# Patient Record
Sex: Female | Born: 1960 | Race: White | Hispanic: No | Marital: Married | Smoking: Former smoker
Health system: Southern US, Community
[De-identification: ages and names within clinical notes are randomized; demographics above are authoritative.]

## PROBLEM LIST (undated history)

## (undated) DIAGNOSIS — K219 Gastro-esophageal reflux disease without esophagitis: Secondary | ICD-10-CM

## (undated) DIAGNOSIS — I2510 Atherosclerotic heart disease of native coronary artery without angina pectoris: Secondary | ICD-10-CM

## (undated) DIAGNOSIS — D219 Benign neoplasm of connective and other soft tissue, unspecified: Secondary | ICD-10-CM

## (undated) DIAGNOSIS — M8580 Other specified disorders of bone density and structure, unspecified site: Secondary | ICD-10-CM

## (undated) DIAGNOSIS — E785 Hyperlipidemia, unspecified: Secondary | ICD-10-CM

## (undated) DIAGNOSIS — I10 Essential (primary) hypertension: Secondary | ICD-10-CM

## (undated) HISTORY — PX: TONSILLECTOMY AND ADENOIDECTOMY: SHX28

## (undated) HISTORY — DX: Other specified disorders of bone density and structure, unspecified site: M85.80

## (undated) HISTORY — DX: Benign neoplasm of connective and other soft tissue, unspecified: D21.9

## (undated) HISTORY — DX: Gastro-esophageal reflux disease without esophagitis: K21.9

## (undated) HISTORY — PX: CARDIAC CATHETERIZATION: SHX172

## (undated) HISTORY — PX: APPENDECTOMY: SHX54

---

## 2009-02-09 HISTORY — PX: ABDOMINAL HYSTERECTOMY: SHX81

## 2012-09-07 ENCOUNTER — Ambulatory Visit (INDEPENDENT_AMBULATORY_CARE_PROVIDER_SITE_OTHER): Payer: Self-pay | Admitting: Gynecology

## 2012-09-07 ENCOUNTER — Encounter: Payer: Self-pay | Admitting: Gynecology

## 2012-09-07 ENCOUNTER — Other Ambulatory Visit (HOSPITAL_COMMUNITY)
Admission: RE | Admit: 2012-09-07 | Discharge: 2012-09-07 | Disposition: A | Discharge status: Home / Self Care | Payer: BC Managed Care – PPO | Source: Ambulatory Visit | Attending: Gynecology | Admitting: Gynecology

## 2012-09-07 VITALS — BP 132/86 | Ht 61.5 in | Wt 140.0 lb

## 2012-09-07 DIAGNOSIS — Z780 Asymptomatic menopausal state: Secondary | ICD-10-CM | POA: Insufficient documentation

## 2012-09-07 DIAGNOSIS — Z1159 Encounter for screening for other viral diseases: Secondary | ICD-10-CM

## 2012-09-07 DIAGNOSIS — Z01419 Encounter for gynecological examination (general) (routine) without abnormal findings: Secondary | ICD-10-CM

## 2012-09-07 DIAGNOSIS — R6882 Decreased libido: Secondary | ICD-10-CM | POA: Insufficient documentation

## 2012-09-07 DIAGNOSIS — N951 Menopausal and female climacteric states: Secondary | ICD-10-CM

## 2012-09-07 DIAGNOSIS — Z1151 Encounter for screening for human papillomavirus (HPV): Secondary | ICD-10-CM | POA: Insufficient documentation

## 2012-09-07 MED ORDER — EST ESTROGENS-METHYLTEST 0.625-1.25 MG PO TABS: 1.0000 | ORAL_TABLET | Freq: Every day | ORAL | Status: DC

## 2012-09-07 NOTE — Patient Instructions (Addendum)
Hormone Therapy At menopause, your body begins making less estrogen and progesterone hormones. This causes the body to stop having menstrual periods. This is because estrogen and progesterone hormones control your periods and menstrual cycle. A lack of estrogen may cause symptoms such as:  Hot flushes (or hot flashes).  Vaginal dryness.  Dry skin.  Loss of sex drive.  Risk of bone loss (osteoporosis). When this happens, you may choose to take hormone therapy to get back the estrogen lost during menopause. When the hormone estrogen is given alone, it is usually referred to as ET (Estrogen Therapy). When the hormone progestin is combined with estrogen, it is generally called HT (Hormone Therapy). This was formerly known as hormone replacement therapy (HRT). Your caregiver can help you make a decision on what will be best for you. The decision to use HT seems to change often as new studies are done. Many studies do not agree on the benefits of hormone replacement therapy. LIKELY BENEFITS OF HT INCLUDE PROTECTION FROM:  Hot Flushes (also called hot flashes) - A hot flush is a sudden feeling of heat that spreads over the face and body. The skin may redden like a blush. It is connected with sweats and sleep disturbance. Women going through menopause may have hot flushes a few times a month or several times per day depending on the woman.  Osteoporosis (bone loss)- Estrogen helps guard against bone loss. After menopause, a woman's bones slowly lose calcium and become weak and brittle. As a result, bones are more likely to break. The hip, wrist, and spine are affected most often. Hormone therapy can help slow bone loss after menopause. Weight bearing exercise and taking calcium with vitamin D also can help prevent bone loss. There are also medications that your caregiver can prescribe that can help prevent osteoporosis.  Vaginal Dryness - Loss of estrogen causes changes in the vagina. Its lining may  become thin and dry. These changes can cause pain and bleeding during sexual intercourse. Dryness can also lead to infections. This can cause burning and itching. (Vaginal estrogen treatment can help relieve pain, itching, and dryness.)  Urinary Tract Infections are more common after menopause because of lack of estrogen. Some women also develop urinary incontinence because of low estrogen levels in the vagina and bladder.  Possible other benefits of estrogen include a positive effect on mood and short-term memory in women. RISKS AND COMPLICATIONS  Using estrogen alone without progesterone causes the lining of the uterus to grow. This increases the risk of lining of the uterus (endometrial) cancer. Your caregiver should give another hormone called progestin if you have a uterus.  Women who take combined (estrogen and progestin) HT appear to have an increased risk of breast cancer. The risk appears to be small, but increases throughout the time that HT is taken.  Combined therapy also makes the breast tissue slightly denser which makes it harder to read mammograms (breast X-rays).  Combined, estrogen and progesterone therapy can be taken together every day, in which case there may be spotting of blood. HT therapy can be taken cyclically in which case you will have menstrual periods. Cyclically means HT is taken for a set amount of days, then not taken, then this process is repeated.  HT may increase the risk of stroke, heart attack, breast cancer and forming blood clots in your leg.  Transdermal estrogen (estrogen that is absorbed through the skin with a patch or a cream) may have more positive results with:    Cholesterol.  Blood pressure.  Blood clots. Having the following conditions may indicate you should not have HT:  Endometrial cancer.  Liver disease.  Breast cancer.  Heart disease.  History of blood clots.  Stroke. TREATMENT   If you choose to take HT and have a uterus,  usually estrogen and progestin are prescribed.  Your caregiver will help you decide the best way to take the medications.  Possible ways to take estrogen include:  Pills.  Patches.  Gels.  Sprays.  Vaginal estrogen cream, rings and tablets.  It is best to take the lowest dose possible that will help your symptoms and take them for the shortest period of time that you can.  Hormone therapy can help relieve some of the problems (symptoms) that affect women at menopause. Before making a decision about HT, talk to your caregiver about what is best for you. Be well informed and comfortable with your decisions. HOME CARE INSTRUCTIONS   Follow your caregivers advice when taking the medications.  A Pap test is done to screen for cervical cancer.  The first Pap test should be done at age 21.  Between ages 21 and 29, Pap tests are repeated every 2 years.  Beginning at age 30, you are advised to have a Pap test every 3 years as long as your past 3 Pap tests have been normal.  Some women have medical problems that increase the chance of getting cervical cancer. Talk to your caregiver about these problems. It is especially important to talk to your caregiver if a new problem develops soon after your last Pap test. In these cases, your caregiver may recommend more frequent screening and Pap tests.  The above recommendations are the same for women who have or have not gotten the vaccine for HPV (Human Papillomavirus).  If you had a hysterectomy for a problem that was not a cancer or a condition that could lead to cancer, then you no longer need Pap tests. However, even if you no longer need a Pap test, a regular exam is a good idea to make sure no other problems are starting.   If you are between ages 65 and 70, and you have had normal Pap tests going back 10 years, you no longer need Pap tests. However, even if you no longer need a Pap test, a regular exam is a good idea to make sure no  other problems are starting.   If you have had past treatment for cervical cancer or a condition that could lead to cancer, you need Pap tests and screening for cancer for at least 20 years after your treatment.  If Pap tests have been discontinued, risk factors (such as a new sexual partner) need to be re-assessed to determine if screening should be resumed.  Some women may need screenings more often if they are at high risk for cervical cancer.  Get mammograms done as per the advice of your caregiver. SEEK IMMEDIATE MEDICAL CARE IF:  You develop abnormal vaginal bleeding.  You have pain or swelling in your legs, shortness of breath, or chest pain.  You develop dizziness or headaches.  You have lumps or changes in your breasts or armpits.  You have slurred speech.  You develop weakness or numbness of your arms or legs.  You have pain, burning, or bleeding when urinating.  You develop abdominal pain. Document Released: 10/25/2002 Document Revised: 04/20/2011 Document Reviewed: 02/12/2010 ExitCare Patient Information 2014 [employer]. Menopause Menopause is the normal time   of life when menstrual periods stop completely. Menopause is complete when you have missed 12 consecutive menstrual periods. It usually occurs between the ages of 48 to 55, with an average age of 51. Very rarely does a woman develop menopause before 52 years old. At menopause, your ovaries stop producing the female hormones, estrogen and progesterone. This can cause undesirable symptoms and also affect your health. Sometimes the symptoms may occur 4 to 5 years before the menopause begins. There is no relationship between menopause and:  Oral contraceptives.  Number of children you had.  Race.  The age your menstrual periods started (menarche). Heavy smokers and very thin women may develop menopause earlier in life. CAUSES  The ovaries stop producing the female hormones estrogen and  progesterone.  Other causes include:  Surgery to remove both ovaries.  The ovaries stop functioning for no known reason.  Tumors of the pituitary gland in the brain.  Medical disease that affects the ovaries and hormone production.  Radiation treatment to the abdomen or pelvis.  Chemotherapy that affects the ovaries. SYMPTOMS   Hot flashes.  Night sweats.  Decrease in sex drive.  Vaginal dryness and thinning of the vagina causing painful intercourse.  Dryness of the skin and developing wrinkles.  Headaches.  Tiredness.  Irritability.  Memory problems.  Weight gain.  Bladder infections.  Hair growth of the face and chest.  Infertility. More serious symptoms include:  Loss of bone (osteoporosis) causing breaks (fractures).  Depression.  Hardening and narrowing of the arteries (atherosclerosis) causing heart attacks and strokes. DIAGNOSIS   When the menstrual periods have stopped for 12 straight months.  Physical exam.  Hormone studies of the blood. TREATMENT  There are many treatment choices and nearly as many questions about them. The decisions to treat or not to treat menopausal changes is an individual choice made with your caregiver. Your caregiver can discuss the treatments with you. Together, you can decide which treatment will work best for you. Your treatment choices may include:   Hormone therapy (estorgen and progesterone).  Non-hormonal medications.  Treating the individual symptoms with medication (for example antidepressants for depression).  Herbal medications that may help specific symptoms.  Counseling by a psychiatrist or psychologist.  Group therapy.  Lifestyle changes including:  Eating healthy.  Regular exercise.  Limiting caffeine and alcohol.  Stress management and meditation.  No treatment. HOME CARE INSTRUCTIONS   Take the medication your caregiver gives you as directed.  Get plenty of sleep and  rest.  Exercise regularly.  Eat a diet that contains calcium (good for the bones) and soy products (acts like estrogen hormone).  Avoid alcoholic beverages.  Do not smoke.  If you have hot flashes, dress in layers.  Take supplements, calcium and vitamin D to strengthen bones.  You can use over-the-counter lubricants or moisturizers for vaginal dryness.  Group therapy is sometimes very helpful.  Acupuncture may be helpful in some cases. SEEK MEDICAL CARE IF:   You are not sure you are in menopause.  You are having menopausal symptoms and need advice and treatment.  You are still having menstrual periods after age 55.  You have pain with intercourse.  Menopause is complete (no menstrual period for 12 months) and you develop vaginal bleeding.  You need a referral to a specialist (gynecologist, psychiatrist or psychologist) for treatment. SEEK IMMEDIATE MEDICAL CARE IF:   You have severe depression.  You have excessive vaginal bleeding.  You fell and think you have a broken   bone.  You have pain when you urinate.  You develop leg or chest pain.  You have a fast pounding heart beat (palpitations).  You have severe headaches.  You develop vision problems.  You feel a lump in your breast.  You have abdominal pain or severe indigestion. Document Released: 04/18/2003 Document Revised: 04/20/2011 Document Reviewed: 11/24/2007 ExitCare Patient Information 2014 [employer].  

## 2012-09-07 NOTE — Progress Notes (Signed)
[name] 1960/09/04 [id number]   History:    52 y.o.  for annual gyn exam new patient to the practice. Patient stated that she had a normal gynecological examination 2 years ago and was constant. Patient also many years ago had a supracervical hysterectomy as a result of anemia, and leiomyomatous uteri and menorrhagia. Patient denies any prior history of abnormal Pap smear. She has had occasional hot flashes and irritability and mood swings but mostly she's been having decreased libido. She denies any vaginal dryness. She reports normal mammogram 2 years ago and was constant. She also reports a normal colonoscopy in 2013. Patient has not had a bone density done yet.  Past medical history,surgical history, family history and social history were all reviewed and documented in the EPIC chart.  Gynecologic History No LMP recorded. Patient has had a hysterectomy. Contraception: status post hysterectomy Last Pap: 2 years ago. Results were: normal Last mammogram: 2 years ago. Results were: normal  Obstetric History OB History   Grav Para Term Preterm Abortions TAB SAB Ect Mult Living   4 3   1  1   3      # Outc Date GA Lbr Len/2nd Wgt Sex Del Anes PTL Lv   1 PAR            2 PAR            3 PAR            4 SAB                ROS: A ROS was performed and pertinent positives and negatives are included in the history.  GENERAL: No fevers or chills. HEENT: No change in vision, no earache, sore throat or sinus congestion. NECK: No pain or stiffness. CARDIOVASCULAR: No chest pain or pressure. No palpitations. PULMONARY: No shortness of breath, cough or wheeze. GASTROINTESTINAL: No abdominal pain, nausea, vomiting or diarrhea, melena or bright red blood per rectum. GENITOURINARY: No urinary frequency, urgency, hesitancy or dysuria. MUSCULOSKELETAL: No joint or muscle pain, no back pain, no recent trauma. DERMATOLOGIC: No rash, no itching, no lesions. ENDOCRINE: No polyuria, polydipsia, no  heat or cold intolerance. No recent change in weight. HEMATOLOGICAL: No anemia or easy bruising or bleeding. NEUROLOGIC: No headache, seizures, numbness, tingling or weakness. PSYCHIATRIC: No depression, no loss of interest in normal activity or change in sleep pattern.     Exam: chaperone present  BP 132/86  Ht 5' 1.5" (1.562 m)  Wt 140 lb (63.504 kg)  BMI 26.03 kg/m2  Body mass index is 26.03 kg/(m^2).  General appearance : Well developed well nourished female. No acute distress HEENT: Neck supple, trachea midline, no carotid bruits, no thyroidmegaly Lungs: Clear to auscultation, no rhonchi or wheezes, or rib retractions  Heart: Regular rate and rhythm, no murmurs or gallops Breast:Examined in sitting and supine position were symmetrical in appearance, no palpable masses or tenderness,  no skin retraction, no nipple inversion, no nipple discharge, no skin discoloration, no axillary or supraclavicular lymphadenopathy Abdomen: no palpable masses or tenderness, no rebound or guarding Extremities: no edema or skin discoloration or tenderness  Pelvic:  Bartholin, Urethra, Skene Glands: Within normal limits             Vagina: No gross lesions or discharge  Cervix: No gross lesions or discharge  Uterus absence  Adnexa  Without masses or tenderness  Anus and perineum  normal   Rectovaginal  normal sphincter tone without palpated masses or  tenderness             Hemoccult Heart provided     Assessment/Plan:  52 y.o. female for annual exam with signs and symptoms consistent with menopause. We will confirmed by doing an [facility] today. Pap smear was done today at the new guidelines were discussed. Blood work was done recently by her PCP. Requisition to schedule mammogram was provided. We discussed importance of calcium and vitamin D for osteoporosis prevention as well as regular exercise. Patient will need a baseline bone density study next year.  New CDC guidelines is recommending patients be  tested once in her lifetime for hepatitis C antibody who were born between [birth year] through 1965. This was discussed with the patient today and has agreed to be tested today.  Hemoccult cards were provided for the patient is admitted to the office for testing.  Patient will be prescribed Estratest 0.625 mg to take 1 by mouth daily  If her  [facility] indicates that she is indeed in the menopause state. The risks benefits and pros and cons of hormone replacement therapy were discussed include DVT and breast cancer risk. We went through a detail discussion of women's health initiative study. Patient fully understands and accepts. All questions were answered and literature information was provided as well.    Ok Edwards MD, 12:30 PM 09/07/2012

## 2012-09-08 ENCOUNTER — Encounter: Payer: Self-pay | Admitting: Gynecology

## 2012-09-26 ENCOUNTER — Other Ambulatory Visit: Payer: Self-pay | Admitting: Gynecology

## 2012-09-26 DIAGNOSIS — Z1231 Encounter for screening mammogram for malignant neoplasm of breast: Secondary | ICD-10-CM

## 2012-10-14 ENCOUNTER — Ambulatory Visit (HOSPITAL_COMMUNITY)
Admission: RE | Admit: 2012-10-14 | Discharge: 2012-10-14 | Disposition: A | Discharge status: Home / Self Care | Payer: BC Managed Care – PPO | Source: Ambulatory Visit | Attending: Gynecology | Admitting: Gynecology

## 2012-10-14 DIAGNOSIS — Z1231 Encounter for screening mammogram for malignant neoplasm of breast: Secondary | ICD-10-CM | POA: Insufficient documentation

## 2012-10-21 ENCOUNTER — Other Ambulatory Visit: Payer: Self-pay | Admitting: Gynecology

## 2012-10-21 DIAGNOSIS — Z1231 Encounter for screening mammogram for malignant neoplasm of breast: Secondary | ICD-10-CM

## 2012-12-15 ENCOUNTER — Other Ambulatory Visit: Payer: Self-pay

## 2013-04-07 ENCOUNTER — Other Ambulatory Visit: Payer: Self-pay

## 2013-04-07 MED ORDER — EST ESTROGENS-METHYLTEST 0.625-1.25 MG PO TABS: 1.0000 | ORAL_TABLET | Freq: Every day | ORAL | Status: DC

## 2013-09-20 ENCOUNTER — Other Ambulatory Visit (HOSPITAL_COMMUNITY)
Admission: RE | Admit: 2013-09-20 | Discharge: 2013-09-20 | Disposition: A | Discharge status: Home / Self Care | Payer: BC Managed Care – PPO | Source: Ambulatory Visit | Attending: Gynecology | Admitting: Gynecology

## 2013-09-20 ENCOUNTER — Encounter: Payer: Self-pay | Admitting: Gynecology

## 2013-09-20 ENCOUNTER — Ambulatory Visit (INDEPENDENT_AMBULATORY_CARE_PROVIDER_SITE_OTHER): Payer: BC Managed Care – PPO | Admitting: Gynecology

## 2013-09-20 VITALS — BP 122/80 | Ht 61.5 in | Wt 136.8 lb

## 2013-09-20 DIAGNOSIS — Z79890 Hormone replacement therapy (postmenopausal): Secondary | ICD-10-CM

## 2013-09-20 DIAGNOSIS — Z01419 Encounter for gynecological examination (general) (routine) without abnormal findings: Secondary | ICD-10-CM | POA: Insufficient documentation

## 2013-09-20 DIAGNOSIS — N950 Postmenopausal bleeding: Secondary | ICD-10-CM | POA: Insufficient documentation

## 2013-09-20 MED ORDER — PROGESTERONE MICRONIZED 200 MG PO CAPS: 200.0000 mg | ORAL_CAPSULE | Freq: Every day | ORAL | Status: DC

## 2013-09-20 MED ORDER — EST ESTROGENS-METHYLTEST 0.625-1.25 MG PO TABS: 1.0000 | ORAL_TABLET | Freq: Every day | ORAL | Status: DC

## 2013-09-20 NOTE — Progress Notes (Signed)
[name] [birth year] [id number]   History:    53 y.o.  for annual gyn exam who was seen last year for the first time as a new patient to the practice. Patient reported having had in [place] several years ago a supracervical hysterectomy as a result of anemia, leiomyomatous uteri and menorrhagia. She denied any prior history of any abnormal Pap smear. She was having hot flashes mood swings and irritability and was perimenopausal and was started on Estratest 0.625 mg daily to help as well with her decreased libido. Patient has been taking 2-3 times a week instead of daily but stated that he has helped with her vasomotor symptoms. She did mention that she has had some vaginal spotting at times for 2 months but lasting only one day. Her PCP has been doing her blood work.  Past medical history,surgical history, family history and social history were all reviewed and documented in the EPIC chart.  Gynecologic History No LMP recorded. Patient has had a hysterectomy. Contraception: status post hysterectomy Last Pap: 2014. Results were: normal Last mammogram: 2014. Results were: normal  Obstetric History OB History  Gravida Para Term Preterm AB SAB TAB Ectopic Multiple Living  4 3   1 1    3     # Outcome Date GA Lbr Len/2nd Weight Sex Delivery Anes PTL Lv  4 SAB           3 PAR           2 PAR           1 PAR                ROS: A ROS was performed and pertinent positives and negatives are included in the history.  GENERAL: No fevers or chills. HEENT: No change in vision, no earache, sore throat or sinus congestion. NECK: No pain or stiffness. CARDIOVASCULAR: No chest pain or pressure. No palpitations. PULMONARY: No shortness of breath, cough or wheeze. GASTROINTESTINAL: No abdominal pain, nausea, vomiting or diarrhea, melena or bright red blood per rectum. GENITOURINARY: No urinary frequency, urgency, hesitancy or dysuria. MUSCULOSKELETAL: No joint or muscle  pain, no back pain, no recent trauma. DERMATOLOGIC: No rash, no itching, no lesions. ENDOCRINE: No polyuria, polydipsia, no heat or cold intolerance. No recent change in weight. HEMATOLOGICAL: No anemia or easy bruising or bleeding. NEUROLOGIC: No headache, seizures, numbness, tingling or weakness. PSYCHIATRIC: No depression, no loss of interest in normal activity or change in sleep pattern.     Exam: chaperone present  BP 122/80  Ht 5' 1.5" (1.562 m)  Wt 136 lb 12.8 oz (62.052 kg)  BMI 25.43 kg/m2  Body mass index is 25.43 kg/(m^2).  General appearance : Well developed well nourished female. No acute distress HEENT: Neck supple, trachea midline, no carotid bruits, no thyroidmegaly Lungs: Clear to auscultation, no rhonchi or wheezes, or rib retractions  Heart: Regular rate and rhythm, no murmurs or gallops Breast:Examined in sitting and supine position were symmetrical in appearance, no palpable masses or tenderness,  no skin retraction, no nipple inversion, no nipple discharge, no skin discoloration, no axillary or supraclavicular lymphadenopathy Abdomen: no palpable masses or tenderness, no rebound or guarding Extremities: no edema or skin discoloration or tenderness  Pelvic:  Bartholin, Urethra, Skene Glands: Within normal limits             Vagina: No gross lesions or discharge  Cervix: No gross lesions or discharge  Uterus  supposedly absent  Adnexa  Without masses or tenderness  Anus and perineum  normal   Rectovaginal  normal sphincter tone without palpated masses or tenderness             Hemoccult PCP we'll provide   Because of patient's postmenopausal bleeding on HRT after a Pap smear was done her cervix is dilated and we attempted to do an endometrial biopsy. Interestingly the catheter went in to a depth of 7 cm??. No tissue obtained to submit for histological evaluation. Patient became nauseated and lightheaded and she was given Zofran 8 mg sublingual and did well. She was  given and Aleve for cramping.  Assessment/Plan:  53 y.o. female for annual exam with postmenopausal bleeding with presumed supracervical hysterectomy had a sounding of the uterine cavity up to 7 cm? No tissue obtained when attempted to do an endometrial biopsy. She will return back to the office next week for a sonohysterogram for better assessment of how much uterus and she still has. We are going to continue the Estratest 0.625 mg daily and it was stressed upon her the importance of taking it daily and not interrupting daily dosage. She will be prescribed Prometrium 200 mg one by mouth daily for 12 days of the month. A Pap smear was done today. She was reminded to schedule her upcoming mammogram next month. Requisition for shingles vaccine provided.  Note: This dictation was prepared with  Dragon/digital dictation along withSmart phrase technology. Any transcriptional errors that result from this process are unintentional.   Terrance Mass MD, 10:37 AM 09/20/2013

## 2013-09-20 NOTE — Patient Instructions (Addendum)
Shingles Vaccine What You Need to Know WHAT IS SHINGLES?  Shingles is a painful skin rash, often with blisters. It is also called Herpes Zoster or just Zoster.  A shingles rash usually appears on one side of the face or body and lasts from 2 to 4 weeks. Its main symptom is pain, which can be quite severe. Other symptoms of shingles can include fever, headache, chills, and upset stomach. Very rarely, a shingles infection can lead to pneumonia, hearing problems, blindness, brain inflammation (encephalitis), or death.  For about 1 person in 5, severe pain can continue even after the rash clears up. This is called post-herpetic neuralgia.  Shingles is caused by the Varicella Zoster virus. This is the same virus that causes chickenpox. Only someone who has had a case of chickenpox or rarely, has gotten chickenpox vaccine, can get shingles. The virus stays in your body. It can reappear many years later to cause a case of shingles.  You cannot catch shingles from another person with shingles. However, a person who has never had chickenpox (or chickenpox vaccine) could get chickenpox from someone with shingles. This is not very common.  Shingles is far more common in people 50 and older than in younger people. It is also more common in people whose immune systems are weakened because of a disease such as cancer or drugs such as steroids or chemotherapy.  At least 1 million people get shingles per year in the [place]. SHINGLES VACCINE  A vaccine for shingles was licensed in 2006. In clinical trials, the vaccine reduced the risk of shingles by 50%. It can also reduce the pain in people who still get shingles after being vaccinated.  A single dose of shingles vaccine is recommended for adults 60 years of age and older. SOME PEOPLE SHOULD NOT GET SHINGLES VACCINE OR SHOULD WAIT A person should not get shingles vaccine if he or she:  Has ever had a life-threatening allergic reaction to gelatin, the  antibiotic neomycin, or any other component of shingles vaccine. Tell your caregiver if you have any severe allergies.  Has a weakened immune system because of current:  AIDS or another disease that affects the immune system.  Treatment with drugs that affect the immune system, such as prolonged use of high-dose steroids.  Cancer treatment, such as radiation or chemotherapy.  Cancer affecting the bone marrow or lymphatic system, such as leukemia or lymphoma.  Is pregnant, or might be pregnant. Women should not become pregnant until at least 4 weeks after getting shingles vaccine. Someone with a minor illness, such as a cold, may be vaccinated. Anyone with a moderate or severe acute illness should usually wait until he or she recovers before getting the vaccine. This includes anyone with a temperature of 101.3 F (38 C) or higher. WHAT ARE THE RISKS FROM SHINGLES VACCINE?  A vaccine, like any medicine, could possibly cause serious problems, such as severe allergic reactions. However, the risk of a vaccine causing serious harm, or death, is extremely small.  No serious problems have been identified with shingles vaccine. Mild Problems  Redness, soreness, swelling, or itching at the site of the injection (about 1 person in 3).  Headache (about 1 person in 70). Like all vaccines, shingles vaccine is being closely monitored for unusual or severe problems. WHAT IF THERE IS A MODERATE OR SEVERE REACTION? What should I look for? Any unusual condition, such as a severe allergic reaction or a high fever. If a severe allergic reaction   occurred, it would be within a few minutes to an hour after the shot. Signs of a serious allergic reaction can include difficulty breathing, weakness, hoarseness or wheezing, a fast heartbeat, hives, dizziness, paleness, or swelling of the throat. What should I do?  Call your caregiver, or get the person to a caregiver right away.  Tell the caregiver what  happened, the date and time it happened, and when the vaccination was given.  Ask the caregiver to report the reaction by filing a Vaccine Adverse Event Reporting System (VAERS) form. Or, you can file this report through the VAERS web site at www.vaers.SamedayNews.es or by calling (501) 352-7610. VAERS does not provide medical advice. HOW CAN I LEARN MORE?  Ask your caregiver. He or she can give you the vaccine package insert or suggest other sources of information.  Contact the Centers for Disease Control and Prevention (CDC):  Call (867)840-5271 (1-800-CDC-INFO).  Visit the CDC website at http://hunter.com/ CDC Shingles Vaccine VIS (11/15/07) Document Released: 11/23/2005 Document Revised: 04/20/2011 Document Reviewed: 05/18/2012 [facility] Patient Information 2015 [employer]. This information is not intended to replace advice given to you by your health care provider. Make sure you discuss any questions you have with your health care provider. Transvaginal Ultrasound Transvaginal ultrasound is a pelvic ultrasound, using a metal probe that is placed in the vagina, to look at a women's female organs. Transvaginal ultrasound is a method of seeing inside the pelvis of a woman. The ultrasound machine sends out sound waves from the transducer (probe). These sound waves bounce off body structures (like an echo) to create a picture. The picture shows up on a monitor. It is called transvaginal because the probe is inserted into the vagina. There should be very little discomfort from the vaginal probe. This test can also be used during pregnancy. Endovaginal ultrasound is another name for a transvaginal ultrasound. In a transabdominal ultrasound, the probe is placed on the outside of the belly. This method gives pictures that are lower quality than pictures from the transvaginal technique. Transvaginal ultrasound is used to look for problems of the female genital tract. Some such problems  include: Infertility problems. Congenital (birth defect) malformations of the uterus and ovaries. Tumors in the uterus. Abnormal bleeding. Ovarian tumors and cysts. Abscess (inflamed tissue around pus) in the pelvis. Unexplained abdominal or pelvic pain. Pelvic infection. DURING PREGNANCY, TRANSVAGINAL ULTRASOUND MAY BE USED TO LOOK AT: Normal pregnancy. Ectopic pregnancy (pregnancy outside the uterus). Fetal heartbeat. Abnormalities in the pelvis, that are not seen well with transabdominal ultrasound. Suspected twins or multiples. Impending miscarriage. Problems with the cervix (incompetent cervix, not able to stay closed and hold the baby). When doing an amniocentesis (removing fluid from the pregnancy sac, for testing). Looking for abnormalities of the baby. Checking the growth, development, and age of the fetus. Measuring the amount of fluid in the amniotic sac. When doing an external version of the baby (moving baby into correct position). Evaluating the baby for problems in high risk pregnancies (biophysical profile). Suspected fetal demise (death). Sometimes a special ultrasound method called Saline Infusion Sonography (SIS) is used for a more accurate look at the uterus. Sterile saline (salt water) is injected into the uterus of non-pregnant patients to see the inside of the uterus better. SIS is not used on pregnant women. The vaginal probe can also assist in obtaining biopsies of abnormal areas, in draining fluid from cysts on the ovary, and in finding IUDs (intrauterine device, birth control) that cannot be located. PREPARATION FOR  TEST A transvaginal ultrasound is done with the bladder empty. The transabdominal ultrasound is done with your bladder full. You may be asked to drink several glasses of water before that exam. Sometimes, a transabdominal ultrasound is done just after a transvaginal ultrasound, to look at organs in your abdomen. PROCEDURE  You will lie down on a  table, with your knees bent and your feet in foot holders. The probe is covered with a condom. A sterile lubricant is put into the vagina and on the probe. The lubricant helps transmit the sound waves and avoid irritating the vagina. Your caregiver will move the probe inside the vaginal cavity to scan the pelvic structures. A normal test will show a normal pelvis and normal contents. An abnormal test will show abnormalities of the pelvis, placenta, or baby. ABNORMAL RESULTS MAY BE DUE TO: Growths or tumors in the: Uterus. Ovaries. Vagina. Other pelvic structures. Non-cancerous growths of the uterus and ovaries. Twisting of the ovary, cutting off blood supply to the ovary (ovarian torsion). Areas of infection, including: Pelvic inflammatory disease. Abscess in the pelvis. Locating an IUD. PROBLEMS FOUND IN PREGNANT WOMEN MAY INCLUDE: Ectopic pregnancy (pregnancy outside the uterus). Multiple pregnancies. Early dilation (opening) of the cervix. This may indicate an incompetent cervix and early delivery. Impending miscarriage. Fetal death. Problems with the placenta, including: Placenta has grown over the opening of the womb (placenta previa). Placenta has separated early in the womb (placental abruption). Placenta grows into the muscle of the uterus (placenta accreta). Tumors of pregnancy, including gestational trophoblastic disease. This is an abnormal pregnancy, with no fetus. The uterus is filled with many grape-like cysts that could sometimes be cancerous. Incorrect position of the fetus (breech, vertex). Intrauterine fetal growth retardation (IUGR) (poor growth in the womb). Fetal abnormalities or infection. RISKS AND COMPLICATIONS There are no known risks to the ultrasound procedure. There is no X-ray used when doing an ultrasound. Document Released: 01/08/2004 Document Revised: 04/20/2011 Document Reviewed: 12/26/2008 [facility] Patient Information 2015 [place], [place]. This  information is not intended to replace advice given to you by your health care provider. Make sure you discuss any questions you have with your health care provider.

## 2013-09-21 LAB — CYTOLOGY - PAP

## 2013-09-28 ENCOUNTER — Other Ambulatory Visit: Payer: Self-pay | Admitting: Gynecology

## 2013-09-28 DIAGNOSIS — N950 Postmenopausal bleeding: Secondary | ICD-10-CM

## 2013-09-28 DIAGNOSIS — Z90711 Acquired absence of uterus with remaining cervical stump: Secondary | ICD-10-CM

## 2013-09-28 DIAGNOSIS — Z79890 Hormone replacement therapy (postmenopausal): Secondary | ICD-10-CM

## 2013-10-18 ENCOUNTER — Other Ambulatory Visit: Payer: Self-pay | Admitting: Gynecology

## 2013-10-18 ENCOUNTER — Ambulatory Visit (INDEPENDENT_AMBULATORY_CARE_PROVIDER_SITE_OTHER): Payer: BC Managed Care – PPO

## 2013-10-18 ENCOUNTER — Ambulatory Visit (INDEPENDENT_AMBULATORY_CARE_PROVIDER_SITE_OTHER): Payer: BC Managed Care – PPO | Admitting: Gynecology

## 2013-10-18 DIAGNOSIS — Z90711 Acquired absence of uterus with remaining cervical stump: Secondary | ICD-10-CM

## 2013-10-18 DIAGNOSIS — Z79890 Hormone replacement therapy (postmenopausal): Secondary | ICD-10-CM

## 2013-10-18 DIAGNOSIS — N83209 Unspecified ovarian cyst, unspecified side: Secondary | ICD-10-CM

## 2013-10-18 DIAGNOSIS — N950 Postmenopausal bleeding: Secondary | ICD-10-CM

## 2013-10-18 NOTE — Progress Notes (Signed)
   Patient presented to the office today for further evaluation of her postmenopausal bleeding. Patient was seen in the office as a new patient on August 12.Patient reported having had in [place] several years ago a supracervical hysterectomy as a result of anemia, leiomyomatous uteri and menorrhagia. She denied any prior history of any abnormal Pap smear. She was having hot flashes mood swings and irritability and was perimenopausal and was started on Estratest 0.625 mg daily to help as well with her decreased libido. Patient has been taking 2-3 times a week instead of daily but stated that he has helped with her vasomotor symptoms. She did mention that she has had some vaginal spotting at times for 2 months but lasting only one day.  It was interesting to the fact that time of her annual exam when she was examined she had a Pap smear that was done and we attempted to introduce a Pipelle which appeared to have entered some form of cavity up to 7 cm and this is the reason she is here today for a sonohysterogram. No tissue was obtained when attempting to do an endometrial biopsy. Since it was recommended the patient take her Estratest every day with the addition of the Prometrium for 12 days of the month.  Sonohysterogram today. Absent uterus. Cervix was seen and appeared to be normal. No fluid in the cul-de-sac. Right ovary was normal. Left thin-walled cyst measuring 17 x 15 mm avascular was noted to free within septum was noted. The cervix was cleansed with Betadine solution and a sterile catheter was introduced through the cervical canal and saline was instilled in an effort to see if there was any endometrial cavity. The cervix but no further uterine tissue was noted. No defect in the cervix. No free fluid in the cul-de-sac. No adnexal fluid was noted.  Assessment/plan: Menopausal patient had issues with compliance on her HRT consisting of Estratest 0.625 mg which she was recommended  to take on a daily basis. It appears she has some active endometrial glands and the remaining lower uterine segment after supracervical hysterectomy but for this reason I recommended that she had Prometrium 200 mg one by mouth daily for 12 days a month. Her recent Pap smear was normal. She was reassured. We will continue to monitor closely otherwise we will see her back next for her annual exam or when necessary

## 2013-12-11 ENCOUNTER — Encounter: Payer: Self-pay | Admitting: Gynecology

## 2014-01-18 ENCOUNTER — Other Ambulatory Visit: Payer: Self-pay | Admitting: Gynecology

## 2014-01-18 DIAGNOSIS — Z1231 Encounter for screening mammogram for malignant neoplasm of breast: Secondary | ICD-10-CM

## 2014-02-14 ENCOUNTER — Other Ambulatory Visit: Payer: Self-pay

## 2014-02-14 MED ORDER — EST ESTROGENS-METHYLTEST 0.625-1.25 MG PO TABS: 1.0000 | ORAL_TABLET | Freq: Every day | ORAL | Status: DC

## 2014-03-13 ENCOUNTER — Ambulatory Visit (HOSPITAL_COMMUNITY)
Admission: RE | Admit: 2014-03-13 | Discharge: 2014-03-13 | Disposition: A | Discharge status: Home / Self Care | Payer: BLUE CROSS/BLUE SHIELD | Source: Ambulatory Visit | Attending: Gynecology | Admitting: Gynecology

## 2014-03-13 DIAGNOSIS — Z1231 Encounter for screening mammogram for malignant neoplasm of breast: Secondary | ICD-10-CM | POA: Insufficient documentation

## 2014-06-19 ENCOUNTER — Other Ambulatory Visit: Payer: Self-pay | Admitting: Gynecology

## 2014-08-28 ENCOUNTER — Other Ambulatory Visit: Payer: Self-pay

## 2014-08-28 MED ORDER — EST ESTROGENS-METHYLTEST 0.625-1.25 MG PO TABS: 1.0000 | ORAL_TABLET | Freq: Every day | ORAL | Status: DC

## 2014-10-12 ENCOUNTER — Encounter: Payer: Self-pay | Admitting: Gynecology

## 2014-10-12 ENCOUNTER — Ambulatory Visit (INDEPENDENT_AMBULATORY_CARE_PROVIDER_SITE_OTHER): Payer: BLUE CROSS/BLUE SHIELD | Admitting: Gynecology

## 2014-10-12 VITALS — BP 138/80 | Ht 61.75 in | Wt 135.0 lb

## 2014-10-12 DIAGNOSIS — Z01419 Encounter for gynecological examination (general) (routine) without abnormal findings: Secondary | ICD-10-CM

## 2014-10-12 DIAGNOSIS — Z780 Asymptomatic menopausal state: Secondary | ICD-10-CM

## 2014-10-12 NOTE — Patient Instructions (Signed)
Densitometra sea  (Bone Densitometry) La densitometra sea es una radiografa especial que mide la densidad de los huesos y se utiliza para predecir el riesgo de fracturas seas. Esta estudio se utiliza para determinar el contenido mineral y la densidad de los huesos para diagnosticar osteoporosis. La osteoporosis es la prdida de tejido seo que hace que el hueso se debilite. Generalmente ocurre en las mujeres que entran en la menopausia. Pero tambin pueden sufrirla los hombres y personas con otras enfermedades.  PREPARACIN PARA LA PRUEBA  No es necesaria la preparacin.  QUIENES DEBEN EXAMINARSE?   Todas las mujeres mayores de 65 aos.  Las mujeres posmenopusicas (50 a 65 aos) con factores de riesgo para osteoporosis.  Las personas que han sufrido fracturas previas realizando actividades normales.  Las personas de contextura corporal delgada (menos de 127 libras [63.5 kg] o con un ndice de masa corporal [IMC] de menos de 21).  Las personas que tengan un padre que haya sufrido una fractura de cadera o que tengan antecedentes de osteoporosis.  Los fumadores.  Las personas que sufren artritis reumatoidea.  Los que consumen alcohol en exceso (ms de 3 medidas la mayor parte de los das).  Las mujeres con menopausia temprana. CUNDO DEBE REALIZAR UN NUEVO ESTUDIO?  Las guas actuales sugieren que se debe esperar por lo menos 2 aos antes de repetir una prueba de densidad sea, si la primera fue normal. Algunos estudios recientes indican que las mujeres con densidad sea normal pueden esperar unos aos antes de repetir un estudio de densitometra sea. Comente estos temas con su mdico.  HALLAZGOS NORMALES:   Normal: menos de una desviacin estndar por debajo de lo normal (superior a -1).  Osteopenia:  1 a 2,5 desviaciones estndar por debajo de lo normal (-1 a -2,5).  Osteoporosis: ms de 2,5 desviaciones estndar por debajo de lo normal (menos de -2,5). Los resultados se  informan como una "puntuacin T" y una "puntuacin Z". La puntuacin T es el nmero que compara la densidad sea con la densidad sea de las mujeres jvenes y sanas. La puntuacin Z es un nmero que compara la densidad sea con las puntuaciones de mujeres de la misma edad, gnero y raza.  Los rangos para los resultados normales pueden variar entre diferentes laboratorios y hospitales. Consulte siempre con su mdico despus de hacer el estudio para comentar el significado de los resultados y si los valores se consideran "dentro de los lmites normales".  SIGNIFICADO DEL ESTUDIO  El mdico leer los resultados y comentar con usted la importancia y el significado de los resultados, as como las opciones de tratamiento y la necesidad de pruebas adicionales, si fuera necesario.  OBTENCIN DE LOS RESULTADOS DE LAS PRUEBAS  Es su responsabilidad retirar el resultado del estudio. Consulte en el laboratorio cuando y cmo podr obtener los resultados.  Document Released: 10/21/2011 [employer] Patient Information 2015 [employer]. This information is not intended to replace advice given to you by your health care provider. Make sure you discuss any questions you have with your health care provider.  

## 2014-10-12 NOTE — Progress Notes (Signed)
[name] 08-30-1960 [id number]   History:    54 y.o.  for annual gyn exam with no complaints today. Patient last year had been evaluated for postmenopausal bleeding she had been on unopposed estrogen after a supracervical hysterectomy having been done in [facility] as a result of anemia and leiomyomatous uteri and menorrhagia. Patient had an ultrasound and attempted sonohysterogram in our office on September 2015 with the following findings:  Sonohysterogram today. Absent uterus. Cervix was seen and appeared to be normal. No fluid in the cul-de-sac. Right ovary was normal. Left thin-walled cyst measuring 17 x 15 mm avascular was noted to free within septum was noted. The cervix was cleansed with Betadine solution and a sterile catheter was introduced through the cervical canal and saline was instilled in an effort to see if there was any endometrial cavity. The cervix but no further uterine tissue was noted. No defect in the cervix. No free fluid in the cul-de-sac. No adnexal fluid was noted.  To the Estratest 0.625 mg that she was taking daily she was started then on Prometrium 200 mg for the first 12 days of the month as had no further bleeding. Patient had a normal colonoscopy in 2013. No prior history of abnormal Pap smear. Mammogram this year was normal. Patient is not had her bone density study yet. She is going to see her PCP and her blood work next month.  Past medical history,surgical history, family history and social history were all reviewed and documented in the EPIC chart.  Gynecologic History No LMP recorded. Patient has had a hysterectomy. Contraception: status post hysterectomy Last Pap: 2015. Results were: normal Last mammogram: 2016. Results were: normal  Obstetric History OB History  Gravida Para Term Preterm AB SAB TAB Ectopic Multiple Living  4 3   1 1    3     # Outcome Date GA Lbr Len/2nd Weight Sex Delivery Anes PTL Lv  4 SAB             3 Para           2 Para           1 Para                ROS: A ROS was performed and pertinent positives and negatives are included in the history.  GENERAL: No fevers or chills. HEENT: No change in vision, no earache, sore throat or sinus congestion. NECK: No pain or stiffness. CARDIOVASCULAR: No chest pain or pressure. No palpitations. PULMONARY: No shortness of breath, cough or wheeze. GASTROINTESTINAL: No abdominal pain, nausea, vomiting or diarrhea, melena or bright red blood per rectum. GENITOURINARY: No urinary frequency, urgency, hesitancy or dysuria. MUSCULOSKELETAL: No joint or muscle pain, no back pain, no recent trauma. DERMATOLOGIC: No rash, no itching, no lesions. ENDOCRINE: No polyuria, polydipsia, no heat or cold intolerance. No recent change in weight. HEMATOLOGICAL: No anemia or easy bruising or bleeding. NEUROLOGIC: No headache, seizures, numbness, tingling or weakness. PSYCHIATRIC: No depression, no loss of interest in normal activity or change in sleep pattern.     Exam: chaperone present  BP 138/80 mmHg  Ht 5' 1.75" (1.568 m)  Wt 135 lb (61.236 kg)  BMI 24.91 kg/m2  Body mass index is 24.91 kg/(m^2).  General appearance : Well developed well nourished female. No acute distress HEENT: Eyes: no retinal hemorrhage or exudates,  Neck supple, trachea midline, no carotid bruits, no thyroidmegaly Lungs: Clear to auscultation, no rhonchi  or wheezes, or rib retractions  Heart: Regular rate and rhythm, no murmurs or gallops Breast:Examined in sitting and supine position were symmetrical in appearance, no palpable masses or tenderness,  no skin retraction, no nipple inversion, no nipple discharge, no skin discoloration, no axillary or supraclavicular lymphadenopathy Abdomen: no palpable masses or tenderness, no rebound or guarding Extremities: no edema or skin discoloration or tenderness  Pelvic:  Bartholin, Urethra, Skene Glands: Within normal limits             Vagina:  No gross lesions or discharge  Cervix: No gross lesions or discharge  Uterus absent  Adnexa  Without masses or tenderness  Anus and perineum  normal   Rectovaginal  normal sphincter tone without palpated masses or tenderness             Hemoccult cards provided     Assessment/Plan:  54 y.o. female for annual exam doing well on HRT consisting of Estratest 0.625 mg daily with the addition of Prometrium 200 mg for the 12 days of the month no reported vaginal bleeding. Pap smear not indicated this year. PCP we'll be doing her blood work. Patient not interested in the flu vaccine. Fecal Hemoccult cards were provided for her to submit to the office for testing. Patient to schedule bone density study. We discussed importance of calcium vitamin D and regular exercise for osteoporosis prevention. She has lost 5 pounds since last year. We discussed importance of monthly breast exams.   Terrance Mass MD, 12:10 PM 10/12/2014

## 2014-10-23 ENCOUNTER — Other Ambulatory Visit: Payer: Self-pay | Admitting: Gynecology

## 2014-10-23 ENCOUNTER — Ambulatory Visit (INDEPENDENT_AMBULATORY_CARE_PROVIDER_SITE_OTHER): Payer: BLUE CROSS/BLUE SHIELD

## 2014-10-23 DIAGNOSIS — M899 Disorder of bone, unspecified: Secondary | ICD-10-CM | POA: Diagnosis not present

## 2014-10-23 DIAGNOSIS — Z780 Asymptomatic menopausal state: Secondary | ICD-10-CM | POA: Diagnosis not present

## 2014-10-23 DIAGNOSIS — Z13820 Encounter for screening for osteoporosis: Secondary | ICD-10-CM

## 2014-10-23 DIAGNOSIS — M8580 Other specified disorders of bone density and structure, unspecified site: Secondary | ICD-10-CM

## 2014-11-09 ENCOUNTER — Other Ambulatory Visit: Payer: Self-pay | Admitting: *Deleted

## 2014-11-09 MED ORDER — EST ESTROGENS-METHYLTEST 0.625-1.25 MG PO TABS: 1.0000 | ORAL_TABLET | Freq: Every day | ORAL | Status: DC

## 2014-11-09 NOTE — Telephone Encounter (Signed)
Only 5 refills allowed on this medication per epic,

## 2015-09-17 ENCOUNTER — Other Ambulatory Visit: Payer: Self-pay | Admitting: Gynecology

## 2015-09-17 DIAGNOSIS — Z1231 Encounter for screening mammogram for malignant neoplasm of breast: Secondary | ICD-10-CM

## 2015-10-21 ENCOUNTER — Ambulatory Visit: Payer: BLUE CROSS/BLUE SHIELD

## 2015-11-08 ENCOUNTER — Ambulatory Visit
Admission: RE | Admit: 2015-11-08 | Discharge: 2015-11-08 | Disposition: A | Discharge status: Home / Self Care | Payer: Managed Care, Other (non HMO) | Source: Ambulatory Visit | Attending: Gynecology | Admitting: Gynecology

## 2015-11-08 DIAGNOSIS — Z1231 Encounter for screening mammogram for malignant neoplasm of breast: Secondary | ICD-10-CM

## 2016-05-14 ENCOUNTER — Ambulatory Visit (INDEPENDENT_AMBULATORY_CARE_PROVIDER_SITE_OTHER): Payer: 59 | Admitting: Gynecology

## 2016-05-14 ENCOUNTER — Encounter: Payer: Self-pay | Admitting: Gynecology

## 2016-05-14 VITALS — BP 122/78 | Ht 62.5 in | Wt 144.8 lb

## 2016-05-14 DIAGNOSIS — Z01419 Encounter for gynecological examination (general) (routine) without abnormal findings: Secondary | ICD-10-CM

## 2016-05-14 DIAGNOSIS — M8580 Other specified disorders of bone density and structure, unspecified site: Secondary | ICD-10-CM | POA: Diagnosis not present

## 2016-05-14 NOTE — Patient Instructions (Signed)
Densitometra sea (Bone Densitometry) La densitometra sea es un estudio de diagnstico por imgenes en el que se utiliza una radiografa especial que mide la cantidad de calcio y otros minerales en los huesos (densidad sea). Este estudio tambin se conoce como examen de densidad mineral sea o radioabsorciometra de doble energa (DEXA). Puede medir la densidad sea en la cadera y la columna. Es similar a una radiografa comn. Tambin pueden hacerle este estudio para:  Diagnosticar una enfermedad que causa huesos dbiles o delgados (osteoporosis).  Predecir el riesgo de un hueso roto (fractura).  Determinar si el tratamiento para la osteoporosis funciona. INFORME A SU MDICO:  Cualquier alergia que tenga.  Todos los medicamentos que utiliza, incluidos vitaminas, hierbas, gotas oftlmicas, cremas y medicamentos de venta libre.  Problemas previos que usted o los miembros de su familia hayan tenido con el uso de anestsicos.  Enfermedades de la sangre que tenga.  Si tiene cirugas previas.  Enfermedades que tenga.  Probabilidad de embarazo.  Cualquier otro estudio mdico al que se haya sometido en los ltimos 14 das en el que se haya utilizado material de contraste. RIESGOS Y COMPLICACIONES En general, se trata de un procedimiento seguro. Sin embargo, pueden ocurrir algunos problemas, entre los que se pueden incluir los siguientes:  Este estudio lo expone a una cantidad muy pequea de radiacin.  Los riesgos de la exposicin a la radiacin pueden ser mayores para los nios por nacer. ANTES DEL PROCEDIMIENTO  No tome ningn suplemento de calcio durante 24 horas antes de realizarse el estudio. Puede comer y beber como lo hace habitualmente.  Qutese todas las joyas de metal, anteojos, aparatos dentales y cualquier otro objeto metlico. PROCEDIMIENTO  Deber recostarse en una camilla. Un generador de rayos X estar ubicado debajo de usted y un dispositivo de imgenes, por  encima.  Se pueden usar otros dispositivos, como cajas o abrazaderas, para posicionar el cuerpo apropiadamente para la exploracin.  Deber permanecer inmvil mientras la mquina explore lentamente su cuerpo.  Las imgenes se muestran en el monitor de una computadora. DESPUS DEL PROCEDIMIENTO Es posible que necesite estudios adicionales ms adelante. Esta informacin no tiene como fin reemplazar el consejo del mdico. Asegrese de hacerle al mdico cualquier pregunta que tenga. Document Released: 10/21/2011 Document Revised: 02/16/2014 Document Reviewed: 07/06/2013 [employer] Interactive Patient Education  2017 [employer]  

## 2016-05-14 NOTE — Progress Notes (Signed)
[name] [birth year] [id number]   History:    56 y.o.  for annual gyn exam  with no complaints today. Patient has not been seen in the office since 2016. Patient had been on Estratest 0.625 mg daily with the addition of Prometrium 200 mg for the first 12 days of the month but has been off of it for over a year and has no vasomotor symptoms and no vaginal bleeding and is otherwise asymptomatic. Review of her record indicated that her colonoscopy was normal in 2013. She had a supracervical hysterectomy in [place] several years ago as a result of symptomatic leiomyomatous uteri (menorrhagia and anemia). Patient with no previous history of any abnormal Pap smears.  Bone density study here in our office in 2016 demonstrated evidence of osteopenia (decreased bone mineralization) right femoral neck T score -1.4 with normal Frax analysis. Patient's PCP Dr. Nonda Lou has been doing her blood work and all her vaccines are up-to-date to include the shingles vaccine.   Past medical history,surgical history, family history and social history were all reviewed and documented in the EPIC chart.  Gynecologic History No LMP recorded. Patient has had a hysterectomy. Contraception: status post hysterectomy Last Pap: 2015. Results were: normal Last mammogram: 2017. Results were: normal  Obstetric History OB History  Gravida Para Term Preterm AB Living  4 3     1 3   SAB TAB Ectopic Multiple Live Births  1            # Outcome Date GA Lbr Len/2nd Weight Sex Delivery Anes PTL Lv  4 SAB           3 Para           2 Para           1 Para                ROS: A ROS was performed and pertinent positives and negatives are included in the history.  GENERAL: No fevers or chills. HEENT: No change in vision, no earache, sore throat or sinus congestion. NECK: No pain or stiffness. CARDIOVASCULAR: No chest pain or pressure. No palpitations. PULMONARY: No shortness of  breath, cough or wheeze. GASTROINTESTINAL: No abdominal pain, nausea, vomiting or diarrhea, melena or bright red blood per rectum. GENITOURINARY: No urinary frequency, urgency, hesitancy or dysuria. MUSCULOSKELETAL: No joint or muscle pain, no back pain, no recent trauma. DERMATOLOGIC: No rash, no itching, no lesions. ENDOCRINE: No polyuria, polydipsia, no heat or cold intolerance. No recent change in weight. HEMATOLOGICAL: No anemia or easy bruising or bleeding. NEUROLOGIC: No headache, seizures, numbness, tingling or weakness. PSYCHIATRIC: No depression, no loss of interest in normal activity or change in sleep pattern.     Exam: chaperone present  BP 122/78   Ht 5' 2.5" (1.588 m)   Wt 144 lb 12.8 oz (65.7 kg)   BMI 26.06 kg/m   Body mass index is 26.06 kg/m.  General appearance : Well developed well nourished female. No acute distress HEENT: Eyes: no retinal hemorrhage or exudates,  Neck supple, trachea midline, no carotid bruits, no thyroidmegaly Lungs: Clear to auscultation, no rhonchi or wheezes, or rib retractions  Heart: Regular rate and rhythm, no murmurs or gallops Breast:Examined in sitting and supine position were symmetrical in appearance, no palpable masses or tenderness,  no skin retraction, no nipple inversion, no nipple discharge, no skin discoloration, no axillary or supraclavicular lymphadenopathy Abdomen: no palpable masses or tenderness, no rebound  or guarding Extremities: no edema or skin discoloration or tenderness  Pelvic:  Bartholin, Urethra, Skene Glands: Within normal limits             Vagina: No gross lesions or discharge  Cervix: No gross lesions or discharge  Uterus  history of supracervical hysterectomy  Adnexa  Without masses or tenderness  Anus and perineum  normal   Rectovaginal  normal sphincter tone without palpated masses or tenderness             Hemoccult cards will be provided     Assessment/Plan:  56 y.o. female for annual exam doing well  and no longer on hormone replacement therapy. We discussed importance of calcium and vitamin D and weightbearing exercises for osteoporosis prevention. She is due for her next bone density study in September of this year. PCP has done her blood work and her vaccines are up-to-date. Pap smear without HPV screening was done today according to the guidelines. Patient was provided with fecal Hemoccult cards for testing which she will submit to the office at a later date. She was reminded also that she needs her mammogram in September this year.  Terrance Mass MD, 10:47 AM 05/14/2016

## 2016-06-24 ENCOUNTER — Encounter: Payer: Self-pay | Admitting: Gynecology

## 2016-10-10 DIAGNOSIS — M8580 Other specified disorders of bone density and structure, unspecified site: Secondary | ICD-10-CM

## 2016-10-10 HISTORY — DX: Other specified disorders of bone density and structure, unspecified site: M85.80

## 2016-10-14 ENCOUNTER — Other Ambulatory Visit: Payer: Self-pay | Admitting: Gynecology

## 2016-10-14 DIAGNOSIS — Z13820 Encounter for screening for osteoporosis: Secondary | ICD-10-CM

## 2016-10-27 ENCOUNTER — Encounter: Payer: Self-pay | Admitting: Gynecology

## 2016-10-27 ENCOUNTER — Telehealth: Payer: Self-pay | Admitting: Gynecology

## 2016-10-27 ENCOUNTER — Ambulatory Visit (INDEPENDENT_AMBULATORY_CARE_PROVIDER_SITE_OTHER): Payer: BLUE CROSS/BLUE SHIELD

## 2016-10-27 DIAGNOSIS — Z13820 Encounter for screening for osteoporosis: Secondary | ICD-10-CM

## 2016-10-27 DIAGNOSIS — M8589 Other specified disorders of bone density and structure, multiple sites: Secondary | ICD-10-CM

## 2016-10-27 NOTE — Telephone Encounter (Signed)
Tell patient her most recent bone density shows osteopenia with slight loss from her prior study of both the spine and right hip. The calculated fracture risk is not increased indicate the need for medication at this time. I would recommend weightbearing exercise on a regular basis such as walking, checking a vitamin D level either at her primary physician's office or our office to make sure she is in the therapeutic range. I would also recommend total dietary calcium and 1500 mg daily. Repeat the bone density in 2 years.

## 2016-10-28 ENCOUNTER — Other Ambulatory Visit: Payer: Self-pay | Admitting: Gynecology

## 2016-10-28 DIAGNOSIS — M8589 Other specified disorders of bone density and structure, multiple sites: Secondary | ICD-10-CM

## 2016-10-28 DIAGNOSIS — Z13820 Encounter for screening for osteoporosis: Secondary | ICD-10-CM

## 2016-10-29 ENCOUNTER — Encounter: Payer: Self-pay | Admitting: *Deleted

## 2016-10-29 NOTE — Telephone Encounter (Signed)
Sent patient mychart message

## 2016-11-12 NOTE — Telephone Encounter (Signed)
Patient informed. 

## 2017-01-21 ENCOUNTER — Other Ambulatory Visit: Payer: Self-pay | Admitting: Obstetrics & Gynecology

## 2017-01-21 DIAGNOSIS — Z139 Encounter for screening, unspecified: Secondary | ICD-10-CM

## 2017-02-22 ENCOUNTER — Ambulatory Visit
Admission: RE | Admit: 2017-02-22 | Discharge: 2017-02-22 | Disposition: A | Discharge status: Home / Self Care | Payer: Commercial Managed Care - PPO | Source: Ambulatory Visit | Attending: Obstetrics & Gynecology | Admitting: Obstetrics & Gynecology

## 2017-02-22 DIAGNOSIS — Z139 Encounter for screening, unspecified: Secondary | ICD-10-CM

## 2017-12-03 ENCOUNTER — Inpatient Hospital Stay (HOSPITAL_COMMUNITY)
Admission: EM | Admit: 2017-12-03 | Discharge: 2017-12-12 | DRG: 234 | Disposition: A | Discharge status: Home / Self Care | Payer: Commercial Managed Care - PPO | Attending: Cardiothoracic Surgery | Admitting: Cardiothoracic Surgery

## 2017-12-03 ENCOUNTER — Emergency Department (HOSPITAL_COMMUNITY): Payer: Commercial Managed Care - PPO

## 2017-12-03 ENCOUNTER — Encounter (HOSPITAL_COMMUNITY): Payer: Self-pay

## 2017-12-03 DIAGNOSIS — M8580 Other specified disorders of bone density and structure, unspecified site: Secondary | ICD-10-CM | POA: Diagnosis present

## 2017-12-03 DIAGNOSIS — Z419 Encounter for procedure for purposes other than remedying health state, unspecified: Secondary | ICD-10-CM

## 2017-12-03 DIAGNOSIS — I25110 Atherosclerotic heart disease of native coronary artery with unstable angina pectoris: Principal | ICD-10-CM

## 2017-12-03 DIAGNOSIS — D62 Acute posthemorrhagic anemia: Secondary | ICD-10-CM | POA: Diagnosis not present

## 2017-12-03 DIAGNOSIS — S0000XA Unspecified superficial injury of scalp, initial encounter: Secondary | ICD-10-CM

## 2017-12-03 DIAGNOSIS — R079 Chest pain, unspecified: Secondary | ICD-10-CM

## 2017-12-03 DIAGNOSIS — R51 Headache: Secondary | ICD-10-CM | POA: Diagnosis present

## 2017-12-03 DIAGNOSIS — Z8249 Family history of ischemic heart disease and other diseases of the circulatory system: Secondary | ICD-10-CM

## 2017-12-03 DIAGNOSIS — R739 Hyperglycemia, unspecified: Secondary | ICD-10-CM | POA: Diagnosis not present

## 2017-12-03 DIAGNOSIS — I25119 Atherosclerotic heart disease of native coronary artery with unspecified angina pectoris: Secondary | ICD-10-CM

## 2017-12-03 DIAGNOSIS — Z882 Allergy status to sulfonamides status: Secondary | ICD-10-CM

## 2017-12-03 DIAGNOSIS — J9382 Other air leak: Secondary | ICD-10-CM | POA: Diagnosis not present

## 2017-12-03 DIAGNOSIS — I259 Chronic ischemic heart disease, unspecified: Secondary | ICD-10-CM

## 2017-12-03 DIAGNOSIS — E8770 Fluid overload, unspecified: Secondary | ICD-10-CM | POA: Diagnosis not present

## 2017-12-03 DIAGNOSIS — I10 Essential (primary) hypertension: Secondary | ICD-10-CM | POA: Diagnosis present

## 2017-12-03 DIAGNOSIS — K219 Gastro-esophageal reflux disease without esophagitis: Secondary | ICD-10-CM | POA: Diagnosis present

## 2017-12-03 DIAGNOSIS — Z79899 Other long term (current) drug therapy: Secondary | ICD-10-CM

## 2017-12-03 DIAGNOSIS — Z951 Presence of aortocoronary bypass graft: Secondary | ICD-10-CM

## 2017-12-03 DIAGNOSIS — E782 Mixed hyperlipidemia: Secondary | ICD-10-CM | POA: Diagnosis present

## 2017-12-03 DIAGNOSIS — Z90710 Acquired absence of both cervix and uterus: Secondary | ICD-10-CM

## 2017-12-03 LAB — BASIC METABOLIC PANEL
Anion gap: 10 (ref 5–15)
BUN: 14 mg/dL (ref 6–20)
CALCIUM: 9.7 mg/dL (ref 8.9–10.3)
CHLORIDE: 107 mmol/L (ref 98–111)
CO2: 22 mmol/L (ref 22–32)
Creatinine, Ser: 0.76 mg/dL (ref 0.44–1.00)
GFR calc Af Amer: >60 mL/min (ref >60)
GFR calc non Af Amer: >60 mL/min (ref >60)
GLUCOSE: 97 mg/dL (ref 70–99)
Potassium: 3.4 mmol/L — ABNORMAL LOW (ref 3.5–5.1)
Sodium: 139 mmol/L (ref 135–145)

## 2017-12-03 LAB — CBC
HEMATOCRIT: 40 % (ref 36.0–46.0)
Hemoglobin: 13.3 g/dL (ref 12.0–15.0)
MCH: 27.5 pg (ref 26.0–34.0)
MCHC: 33.3 g/dL (ref 30.0–36.0)
MCV: 82.6 fL (ref 80.0–100.0)
NRBC: 0 % (ref 0.0–0.2)
Platelets: 389 10*3/uL (ref 150–400)
RBC: 4.84 MIL/uL (ref 3.87–5.11)
RDW: 13 % (ref 11.5–15.5)
WBC: 7.3 10*3/uL (ref 4.0–10.5)

## 2017-12-03 LAB — I-STAT TROPONIN, ED: TROPONIN I, POC: 0 ng/mL (ref 0.00–0.08)

## 2017-12-03 MED ORDER — METOPROLOL TARTRATE 5 MG/5ML IV SOLN
10.0000 mg | Freq: Once | INTRAVENOUS | Status: AC
  Administered 2017-12-03: 10 mg via INTRAVENOUS
  Filled 2017-12-03: qty 10

## 2017-12-03 MED ORDER — ALPRAZOLAM 0.25 MG PO TABS
0.2500 mg | ORAL_TABLET | Freq: Once | ORAL | Status: AC
  Administered 2017-12-03: 0.25 mg via ORAL

## 2017-12-03 MED ORDER — NITROGLYCERIN 0.4 MG SL SUBL
0.4000 mg | SUBLINGUAL_TABLET | SUBLINGUAL | Status: DC | PRN
  Administered 2017-12-03: 0.8 mg via SUBLINGUAL
  Filled 2017-12-03: qty 1

## 2017-12-03 MED ORDER — ALPRAZOLAM 0.25 MG PO TABS
0.5000 mg | ORAL_TABLET | Freq: Once | ORAL | Status: DC
  Filled 2017-12-03: qty 2

## 2017-12-03 MED ORDER — IOPAMIDOL (ISOVUE-370) INJECTION 76%
100.0000 mL | Freq: Once | INTRAVENOUS | Status: AC | PRN
  Administered 2017-12-03: 100 mL via INTRAVENOUS

## 2017-12-03 MED ORDER — METOPROLOL TARTRATE 25 MG PO TABS
100.0000 mg | ORAL_TABLET | Freq: Once | ORAL | Status: AC
  Administered 2017-12-03: 100 mg via ORAL
  Filled 2017-12-03: qty 4

## 2017-12-03 NOTE — ED Provider Notes (Signed)
Patient placed in Quick Look pathway, seen and evaluated   Chief Complaint: chest pain  HPI: [name] is a 57 y.o. female who presents to the ED with sudden onset of chest pain after getting upset with a coworker about an hour ago. Patient describes the pain as all over her chest with tingling in her fingers. She also c/o headache.   ROS: Neuro: headache  CV: chest pain,   Resp: shortness of breath  Physical Exam:  BP (!) 133/96   Pulse 92   Temp 98.6 F (37 C) (Oral)   Resp 16   SpO2 96%    Gen: No distress  Neuro: Awake and Alert  Skin: Warm and dry  Dr. Burt Knack, cardiology here to see the patient.      Initiation of care has begun. The patient has been counseled on the process, plan, and necessity for staying for the completion/evaluation, and the remainder of the medical screening examination    Ashley Murrain, NP 12/03/17 1419    Noemi Chapel, MD 12/04/17 [phone]

## 2017-12-03 NOTE — ED Provider Notes (Signed)
[facility] EMERGENCY DEPARTMENT Provider Note   CSN: [id number] Arrival date & time: 12/03/17  1309     History   Chief Complaint Chief Complaint  Patient presents with  . Chest Pain    HPI [name] is a 57 y.o. female with a history of HTN, GERD, osteopenia, and uterine fibroids who presents to the emergency department with a chief complaint of chest pain.  The patient endorses chest pain that radiated across the bilateral chest.  She characterizes the pain as dull and achy.  She reports the pain began around noon after she got into an argument with a coworker and lasted for approximately 1 to 2 hours.  Pain was constant during that time.  She reports associated nausea, mild dyspnea, numbness and tingling of the jaw and left hand as well as a headache.  She reports the numbness and tingling in her jaw and hand persisted for several hours after the chest pain resolved.  She reports the headache has remained mild and constant.  She denies diplopia, weakness, vomiting, abdominal pain, fever, cough, chills, neck pain or stiffness, diaphoresis, slurred speech, palpitations, or leg swelling.   He was treated with 324 mg of ASA prior to arrival.  She states that earlier she did feel more lightheaded like she might pass out.  She reports that she has had previous episodes of syncope secondary to low blood pressure, but not for some time.  No recent history of chest pain with exertion. Family hx of CAD- Mom in her 40s; Father in his 50s and Brother in his 35s.  Past medical history includes HTN on lisinopril and mixed hyperlipidemia on atorvastatin.   The history is provided by the patient. No language interpreter was used.    Past Medical History:  Diagnosis Date  . Acid reflux   . Fibroid   . Osteopenia 10/2016   T score -2.1 FRAX's 5.7%/0.3%    Patient Active Problem List   Diagnosis Date Noted  . Chest pain 12/04/2017  . Coronary artery disease  involving native coronary artery of native heart with angina pectoris ([place])   . Essential hypertension   . Chest pain at rest   . PMB (postmenopausal bleeding) 09/20/2013  . Menopause 09/07/2012  . Libido, decreased 09/07/2012    Past Surgical History:  Procedure Laterality Date  . ABDOMINAL HYSTERECTOMY  2011   SUPRACERVICAL HYSTERECTOMY  . APPENDECTOMY    . CESAREAN SECTION     X3  . TONSILLECTOMY AND ADENOIDECTOMY       OB History    Gravida  4   Para  3   Term      Preterm      AB  1   Living  3     SAB  1   TAB      Ectopic      Multiple      Live Births               Home Medications    Prior to Admission medications   Medication Sig Start Date End Date Taking? Authorizing Provider  atorvastatin (LIPITOR) 10 MG tablet Take 10 mg by mouth See admin instructions. Monday, Wednesday, Friday and Sunday   Yes [provider]  DULoxetine (CYMBALTA) 60 MG capsule Take 30 mg by mouth daily.    Yes [provider]  famotidine (PEPCID) 40 MG tablet Take 40 mg by mouth 2 (two) times daily.   Yes [provider]  lisinopril (PRINIVIL,ZESTRIL) 30 MG tablet Take 30 mg by mouth daily.   Yes [provider]    Family History Family History  Problem Relation Age of Onset  . Hypertension Mother   . Heart disease Mother   . Diabetes Father   . Hypertension Father   . Heart disease Father   . Diabetes Paternal Grandfather     Social History Social History   Tobacco Use  . Smoking status: Former Smoker    Last attempt to quit: 09/08/1994    Years since quitting: 23.2  . Smokeless tobacco: Never Used  Substance Use Topics  . Alcohol use: Yes    Alcohol/week: 0.0 standard drinks    Comment: occ  . Drug use: No     Allergies   Sulfa antibiotics   Review of Systems Review of Systems  Constitutional: Negative for activity change, chills and fever.  HENT: Negative for congestion and sore throat.   Eyes: Negative  for photophobia and visual disturbance.  Respiratory: Positive for shortness of breath. Negative for apnea, cough, choking and wheezing.   Cardiovascular: Negative for chest pain, palpitations and leg swelling.  Gastrointestinal: Positive for nausea. Negative for abdominal pain, diarrhea and vomiting.  Genitourinary: Negative for dysuria.  Musculoskeletal: Negative for back pain.  Skin: Negative for rash.  Allergic/Immunologic: Negative for immunocompromised state.  Neurological: Positive for light-headedness, numbness and headaches. Negative for dizziness, seizures, syncope and weakness.  Psychiatric/Behavioral: Negative for confusion.     Physical Exam Updated Vital Signs BP (!) 137/99   Pulse 69   Temp 98.6 F (37 C) (Oral)   Resp 16   SpO2 97%   Physical Exam  Constitutional: No distress.  HENT:  Head: Normocephalic.  Eyes: Pupils are equal, round, and reactive to light. Conjunctivae and EOM are normal.  Neck: Neck supple. No JVD present.  Full active and passive range of motion of the cervical spine.   Cardiovascular: Normal rate, regular rhythm, normal heart sounds and intact distal pulses. Exam reveals no gallop and no friction rub.  No murmur heard. Pulmonary/Chest: Effort normal. No stridor. No respiratory distress. She has no wheezes. She has no rales. She exhibits no tenderness.  Abdominal: Soft. She exhibits no distension and no mass. There is no tenderness. There is no rebound and no guarding. No hernia.  Musculoskeletal:  No tenderness to the cervical, thoracic, lumbar spinous processes or bilateral paraspinal muscles.  Neurological: She is alert.  Skin: Skin is warm. No rash noted. She is not diaphoretic.  Psychiatric: Her behavior is normal.  Nursing note and vitals reviewed.  ED Treatments / Results  Labs (all labs ordered are listed, but only abnormal results are displayed) Labs Reviewed  BASIC METABOLIC PANEL - Abnormal; Notable for the following  components:      Result Value   Potassium 3.4 (*)    All other components within normal limits  CBC  CBC  CREATININE, SERUM  TROPONIN I  HEMOGLOBIN A1C  HIV ANTIBODY (ROUTINE TESTING W REFLEX)  TROPONIN I  TROPONIN I  BRAIN NATRIURETIC PEPTIDE  LIPID PANEL  CBC  BASIC METABOLIC PANEL  I-STAT TROPONIN, ED    EKG EKG Interpretation  Date/Time:  Friday December 03 2017 13:19:25 EDT Ventricular Rate:  95 PR Interval:  122 QRS Duration: 80 QT Interval:  368 QTC Calculation: 462 R Axis:   36 Text Interpretation:  Normal sinus rhythm Nonspecific ST and T wave abnormality Prolonged QT Abnormal ECG Confirmed by Nat Christen (  [id number]) on 12/03/2017 9:20:34 PM   Radiology Dg Chest 2 View  Result Date: 12/03/2017 CLINICAL DATA:  Chest pain. EXAM: CHEST - 2 VIEW COMPARISON:  None. FINDINGS: The heart size and pulmonary vascularity are normal. Aortic atherosclerosis. Minimal atelectasis at the lung bases. No effusions. No significant bone abnormality. IMPRESSION: Minimal atelectasis at the lung bases. Electronically Signed   By: Lorriane Shire M.D.   On: 12/03/2017 14:19   Ct Coronary Morph W/cta Cor W/score W/ca W/cm &/or Wo/cm  Result Date: 12/03/2017 CLINICAL DATA:  Chest pain EXAM: Cardiac CTA MEDICATIONS: Sub lingual nitro. 4mg  x 2 TECHNIQUE: The patient was scanned on a Siemens 016 slice scanner. Gantry rotation speed was 250 msecs. Collimation was 0.6 mm. A 100 kV prospective scan was triggered in the ascending thoracic aorta at 35-75% of the R-R interval. Average HR during the scan was 60 bpm. The 3D data set was interpreted on a dedicated work station using MPR, MIP and VRT modes. A total of 80cc of contrast was used. FINDINGS: Non-cardiac: See separate report from [facility]. Pulmonary veins drain normally to the left atrium. Calcium Score: 648 Agatston units. Coronary Arteries: Right dominant with no anomalies LM: Calcified plaque distal left main, mild stenosis. LAD  system: There is extensive mixed plaque in the proximal and mid LAD. There appears to be a 75-90% range ostial LAD stenosis followed by an ectatic segment in the proximal LAD. There is mild stenosis in the mid LAD. Circumflex system: Mixed plaque proximal LCx without significant stenosis. RCA system: Mixed plaque proximal-mid RCA with 75-90% range stenosis in the mid RCA. IMPRESSION: 1. Coronary artery calcium score 648 Agatston units, placing the patient in the 99th percentile for age and gender. This suggests high risk for future cardiac events. 2.  Moderate to severe stenosis in the mid RCA and the ostial LAD. Dalton Mclean Electronically Signed   By: Loralie Champagne M.D.   On: 12/03/2017 23:10    Procedures .Critical Care Performed by: Joanne Gavel, PA-C Authorized by: Joanne Gavel, PA-C   Critical care provider statement:    Critical care time (minutes):  35   Critical care time was exclusive of:  Separately billable procedures and treating other patients and teaching time   Critical care was necessary to treat or prevent imminent or life-threatening deterioration of the following conditions:  Cardiac failure   Critical care was time spent personally by me on the following activities:  Ordering and review of laboratory studies, ordering and review of radiographic studies, ordering and performing treatments and interventions, re-evaluation of patient's condition, review of old charts, examination of patient, evaluation of patient's response to treatment, discussions with consultants, development of treatment plan with patient or surrogate and obtaining history from patient or surrogate   (including critical care time)  Medications Ordered in ED Medications  nitroGLYCERIN (NITROSTAT) SL tablet 0.4 mg (0.8 mg Sublingual Given 12/03/17 2158)  lisinopril (PRINIVIL,ZESTRIL) tablet 30 mg (has no administration in time range)  DULoxetine (CYMBALTA) DR capsule 30 mg (has no administration in  time range)  famotidine (PEPCID) tablet 40 mg (40 mg Oral Given 12/04/17 0141)  aspirin EC tablet 81 mg (has no administration in time range)  acetaminophen (TYLENOL) tablet 650 mg (has no administration in time range)  ondansetron (ZOFRAN) injection 4 mg (has no administration in time range)  heparin injection 5,000 Units (5,000 Units Subcutaneous Given 12/04/17 0141)  atorvastatin (LIPITOR) tablet 40 mg (has no administration in time range)  metoprolol tartrate (  LOPRESSOR) tablet 12.5 mg (has no administration in time range)  ALPRAZolam (XANAX) tablet 0.25 mg (0.25 mg Oral Given 12/03/17 1328)  metoprolol tartrate (LOPRESSOR) tablet 100 mg (100 mg Oral Given 12/03/17 2012)  metoprolol tartrate (LOPRESSOR) injection 10 mg (10 mg Intravenous Given 12/03/17 2148)  iopamidol (ISOVUE-370) 76 % injection 100 mL (100 mLs Intravenous Contrast Given 12/03/17 2156)     Initial Impression / Assessment and Plan / ED Course  I have reviewed the triage vital signs and the nursing notes.  Pertinent labs & imaging results that were available during my care of the patient were reviewed by me and considered in my medical decision making (see chart for details).     57 year old female with a history of GERD, osteopenia, and uterine fibroids who presents to the emergency department with chest pain, dyspnea, nausea, lightheadedness, and headache, onset earlier today.   On arrival, patient was normotensive without tachycardia or tachypnea. Moderate HEART score based on age, risk factors, EKG, and history.   Troponin was negative.  Mild hypokalemia at 3.4.  CBC is unremarkable.  Chest x-ray with minimal atelectasis in the bilateral bases.  EKG with normal sinus rhythm with nonspecific ST and T wave abnormalities.  After arrival in the ED, Dr. Burt Knack with cardiology came to evaluate the patient as the patient's daughter is a pharmacist with [employer]. He recommended coronary CTA/FFR. Dr. Aundra Dubin with  cardiology will plan to review the imaging. If normal, the patient will be written out of work for the weekend and if abnormal will disposition accordingly.    Spoke with CT tech who required metoprolol 10 mg IV and sublingual NTG x2 prior to procedure, which has been ordered.   Coronary CT with moderate to severe stenosis in the rmid RCA and ostial LAD with a coronary artery calcium score of 648 Agtston untis, placing the patient in the 99th percentile for age and gender. Spoke with Nila Nephew, cardiology fellow, who will admit with planned heart catheterization on 12/06/17.  Final Clinical Impressions(s) / ED Diagnoses   Final diagnoses:  Chest pain due to myocardial ischemia, unspecified ischemic chest pain type    ED Discharge Orders    None       Joanne Gavel, PA-C 12/04/17 6754    Nat Christen, MD 12/04/17 2257

## 2017-12-03 NOTE — ED Triage Notes (Signed)
Pt presents for evaluation of chest pain after getting upset with a coworker about an hour ago. Pt is hyperventilating, has tingling and dizziness to hands.

## 2017-12-03 NOTE — Consult Note (Signed)
Cardiology Consultation:   Patient ID: [name] MRN: [id number]; DOB: [birth year]  Admit date: 12/03/2017 Date of Consult: 12/03/2017  Primary Care Provider: Vicenta Aly, [place] Primary Cardiologist: No primary care provider on file.  Primary Electrophysiologist:  None    Patient Profile:   [name] is a 57 y.o. female with a hx of HTN and hyperlipidemia who is being seen today for the evaluation of chest pain at the request of Dr Sabra Heck.  History of Present Illness:   [name] has no past history of coronary artery disease.  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  While at work today, she became involved in a very stressful situation with her boss.  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  She began to develop central chest and left arm pain and tingling.  She describes a "funny feeling" in her chest.  She began to feel very weak and lightheaded.  She ultimately decided to lay down on the floor because she felt like she was close to passing out.  Associated symptoms included left arm and hand tingling, teeth and jaw pain, and blurry vision.  She continued to have chest pressure and left arm pain, but initially refused to call EMS.  Her daughter who is a Software engineer at [employer] came to the patient's work and tried to help her get up but she continued to be too weak to stand up.  At that point EMS was called and the patient was transported to the emergency department.  At the time of my interview, she states she continues to have a funny feeling in her chest but no active pain.  Her left hand symptoms are improving.  She continues to feel weak with some degree of dizziness.  She has no nausea or vomiting at present, but did feel nauseated at the time of the initial event.  The patient is not engaged in any regular exercise.  However, she denies chest pain or pressure  with exertion.  She takes lisinopril for treatment of hypertension and she takes atorvastatin 4 days/week for treatment of hyperlipidemia.  She has a family history of CAD as both parents had coronary bypass surgery in their 51s.  Past Medical History:  Diagnosis Date  . Acid reflux   . Fibroid   . Osteopenia 10/2016   T score -2.1 FRAX's 5.7%/0.3%    Past Surgical History:  Procedure Laterality Date  . ABDOMINAL HYSTERECTOMY  2011   SUPRACERVICAL HYSTERECTOMY  . APPENDECTOMY    . CESAREAN SECTION     X3  . TONSILLECTOMY AND ADENOIDECTOMY       Home Medications:  Prior to Admission medications   Medication Sig Start Date End Date Taking? Authorizing Provider  atorvastatin (LIPITOR) 10 MG tablet Take 10 mg by mouth daily.    [provider]  DULoxetine (CYMBALTA) 60 MG capsule Take 60 mg by mouth daily.    [provider]  estrogen-methylTESTOSTERone 0.625-1.25 MG tablet Take 1 tablet by mouth daily. Patient not taking: Reported on 05/14/2016 11/09/14   Terrance Mass, MD  lisinopril (PRINIVIL,ZESTRIL) 30 MG tablet Take 30 mg by mouth daily.    [provider]    Inpatient Medications: Scheduled Meds:  Continuous Infusions:  PRN Meds:   Allergies:    Allergies  Allergen Reactions  . Sulfa Antibiotics Swelling    Social History:  Social History   Socioeconomic History  . Marital status: Married    Spouse name: Not on file  . Number of children: Not on file  . Years of education: Not on file  . Highest education level: Not on file  Occupational History  . Not on file  Social Needs  . Financial resource strain: Not on file  . Food insecurity:    Worry: Not on file    Inability: Not on file  . Transportation needs:    Medical: Not on file    Non-medical: Not on file  Tobacco Use  . Smoking status: Former Smoker    Last attempt to quit: 09/08/1994    Years since quitting: 23.2  . Smokeless tobacco: Never Used  Substance and  Sexual Activity  . Alcohol use: Yes    Alcohol/week: 0.0 standard drinks    Comment: occ  . Drug use: No  . Sexual activity: Yes  Lifestyle  . Physical activity:    Days per week: Not on file    Minutes per session: Not on file  . Stress: Not on file  Relationships  . Social connections:    Talks on phone: Not on file    Gets together: Not on file    Attends religious service: Not on file    Active member of club or organization: Not on file    Attends meetings of clubs or organizations: Not on file    Relationship status: Not on file  . Intimate partner violence:    Fear of current or ex partner: Not on file    Emotionally abused: Not on file    Physically abused: Not on file    Forced sexual activity: Not on file  Other Topics Concern  . Not on file  Social History Narrative  . Not on file    Family History:    Family History  Problem Relation Age of Onset  . Hypertension Mother   . Heart disease Mother   . Diabetes Father   . Hypertension Father   . Heart disease Father   . Diabetes Paternal Grandfather     CABG - Mother in 42's, alive at 54 years old. Father - CABG in 54's, died at age 67  ROS:  Please see the history of present illness.  Recent arthralgias, otherwise negative  All other ROS reviewed and negative.     Physical Exam/Data:   Vitals:   12/03/17 1416  BP: (!) 133/96  Pulse: 92  Resp: 16  Temp: 98.6 F (37 C)  TempSrc: Oral  SpO2: 96%   No intake or output data in the 24 hours ending 12/03/17 1436 There were no vitals filed for this visit. There is no height or weight on file to calculate BMI.  General:  Well nourished, well developed, mild emotional distress, tearful at times HEENT: normal Lymph: no adenopathy Neck: no JVD Endocrine:  No thryomegaly Vascular: No carotid bruits; FA pulses 2+ bilaterally  Cardiac:  normal S1, S2; RRR; no murmur  Lungs:  clear to auscultation bilaterally, no wheezing, rhonchi or rales  Abd: soft,  nontender, no hepatomegaly  Ext: no edema Musculoskeletal:  No deformities, BUE and BLE strength normal and equal Skin: warm and dry  Neuro:  CNs 2-12 intact, no focal abnormalities noted Back: midline without deformity  EKG:  The EKG was personally reviewed and demonstrates:  NSR, baseline artifact, no ST-T changes  Relevant CV Studies: Pending  Laboratory Data:  Chemistry Recent Labs  Lab 12/03/17 1326  NA 139  K 3.4*  CL 107  CO2 22  GLUCOSE 97  BUN 14  CREATININE 0.76  CALCIUM 9.7  GFRNONAA >60  GFRAA >60  ANIONGAP 10    No results for input(s): PROT, ALBUMIN, AST, ALT, ALKPHOS, BILITOT in the last 168 hours. Hematology Recent Labs  Lab 12/03/17 1326  WBC 7.3  RBC 4.84  HGB 13.3  HCT 40.0  MCV 82.6  MCH 27.5  MCHC 33.3  RDW 13.0  PLT 389   Cardiac EnzymesNo results for input(s): TROPONINI in the last 168 hours.  Recent Labs  Lab 12/03/17 1345  TROPIPOC 0.00    BNPNo results for input(s): BNP, PROBNP in the last 168 hours.  DDimer No results for input(s): DDIMER in the last 168 hours.  Radiology/Studies:  Dg Chest 2 View  Result Date: 12/03/2017 CLINICAL DATA:  Chest pain. EXAM: CHEST - 2 VIEW COMPARISON:  None. FINDINGS: The heart size and pulmonary vascularity are normal. Aortic atherosclerosis. Minimal atelectasis at the lung bases. No effusions. No significant bone abnormality. IMPRESSION: Minimal atelectasis at the lung bases. Electronically Signed   By: Lorriane Shire M.D.   On: 12/03/2017 14:19    Assessment and Plan:   1. Chest pain at rest.  Patient without acute EKG changes.  Initial troponin is 0.00.  Associated symptoms of left arm pain/tingling as well as jaw and teeth pain.  Symptoms are associated with an emotional stress at work.  The patient has received aspirin in the field.  Cardiovascular risk factors include hypertension, hyperlipidemia, former tobacco use, and family history of CAD.  Fortunately her symptoms are improving.  I  think with multiple risk factors and the above clinical context, it is reasonable to proceed with further evaluation for myocardial ischemia.  I have recommended a coronary CTA/FFR.  Further plan/disposition pending these results. 2. Hypertension: Patient treated with lisinopril.  Will follow. 3. Mixed hyperlipidemia: Treated with atorvastatin, lipids have been followed by her primary care physician.   For questions or updates, please contact [place] Please consult [email or web address] for contact info under     Signed, Sherren Mocha, MD  12/03/2017 2:36 PM

## 2017-12-03 NOTE — Progress Notes (Signed)
   Dr. Burt Knack discussed pt with me. CT ordered this afternoon but pt was not in a room until this evening due to pt volume. Per our discussion, plan 18g antecubital gauge IV and Lopressor 100mg  x 1 to beta block patient down to HR goal in the 50s-60s. He also recommended tentatively that we go ahead and write pt out of work for this weekend, to return on Monday (under presumption CT turns out normal). I tubed this down to the ER pod B/D tube station. He communicated with Dr. Aundra Dubin who will read CT this evening. This will not be finalized likely until after fellow comes on call so will sign out to them.  Dayna Dunn PA-C

## 2017-12-04 ENCOUNTER — Other Ambulatory Visit: Payer: Self-pay

## 2017-12-04 ENCOUNTER — Inpatient Hospital Stay (HOSPITAL_COMMUNITY): Payer: Commercial Managed Care - PPO

## 2017-12-04 DIAGNOSIS — E782 Mixed hyperlipidemia: Secondary | ICD-10-CM | POA: Diagnosis present

## 2017-12-04 DIAGNOSIS — I25119 Atherosclerotic heart disease of native coronary artery with unspecified angina pectoris: Secondary | ICD-10-CM

## 2017-12-04 DIAGNOSIS — Z79899 Other long term (current) drug therapy: Secondary | ICD-10-CM | POA: Diagnosis not present

## 2017-12-04 DIAGNOSIS — I10 Essential (primary) hypertension: Secondary | ICD-10-CM | POA: Insufficient documentation

## 2017-12-04 DIAGNOSIS — Z8249 Family history of ischemic heart disease and other diseases of the circulatory system: Secondary | ICD-10-CM | POA: Diagnosis not present

## 2017-12-04 DIAGNOSIS — Z01810 Encounter for preprocedural cardiovascular examination: Secondary | ICD-10-CM | POA: Diagnosis not present

## 2017-12-04 DIAGNOSIS — Z882 Allergy status to sulfonamides status: Secondary | ICD-10-CM | POA: Diagnosis not present

## 2017-12-04 DIAGNOSIS — R51 Headache: Secondary | ICD-10-CM | POA: Diagnosis present

## 2017-12-04 DIAGNOSIS — I5030 Unspecified diastolic (congestive) heart failure: Secondary | ICD-10-CM | POA: Diagnosis not present

## 2017-12-04 DIAGNOSIS — K219 Gastro-esophageal reflux disease without esophagitis: Secondary | ICD-10-CM | POA: Diagnosis present

## 2017-12-04 DIAGNOSIS — M8580 Other specified disorders of bone density and structure, unspecified site: Secondary | ICD-10-CM | POA: Diagnosis present

## 2017-12-04 DIAGNOSIS — R079 Chest pain, unspecified: Secondary | ICD-10-CM

## 2017-12-04 DIAGNOSIS — R739 Hyperglycemia, unspecified: Secondary | ICD-10-CM | POA: Diagnosis not present

## 2017-12-04 DIAGNOSIS — D62 Acute posthemorrhagic anemia: Secondary | ICD-10-CM | POA: Diagnosis not present

## 2017-12-04 DIAGNOSIS — I25110 Atherosclerotic heart disease of native coronary artery with unstable angina pectoris: Secondary | ICD-10-CM | POA: Diagnosis present

## 2017-12-04 DIAGNOSIS — E8770 Fluid overload, unspecified: Secondary | ICD-10-CM | POA: Diagnosis not present

## 2017-12-04 DIAGNOSIS — I200 Unstable angina: Secondary | ICD-10-CM | POA: Diagnosis not present

## 2017-12-04 DIAGNOSIS — Z90710 Acquired absence of both cervix and uterus: Secondary | ICD-10-CM | POA: Diagnosis not present

## 2017-12-04 DIAGNOSIS — J9382 Other air leak: Secondary | ICD-10-CM | POA: Diagnosis not present

## 2017-12-04 LAB — CBC
HCT: 39.2 % (ref 36.0–46.0)
HEMATOCRIT: 40.5 % (ref 36.0–46.0)
HEMOGLOBIN: 12.9 g/dL (ref 12.0–15.0)
Hemoglobin: 12.7 g/dL (ref 12.0–15.0)
MCH: 27.3 pg (ref 26.0–34.0)
MCH: 27.5 pg (ref 26.0–34.0)
MCHC: 31.9 g/dL (ref 30.0–36.0)
MCHC: 32.4 g/dL (ref 30.0–36.0)
MCV: 85.6 fL (ref 80.0–100.0)
MCV: 85 fL (ref 80.0–100.0)
NRBC: 0 % (ref 0.0–0.2)
NRBC: 0 % (ref 0.0–0.2)
Platelets: 365 10*3/uL (ref 150–400)
Platelets: 347 10*3/uL (ref 150–400)
RBC: 4.73 MIL/uL (ref 3.87–5.11)
RBC: 4.61 MIL/uL (ref 3.87–5.11)
RDW: 13.3 % (ref 11.5–15.5)
RDW: 13.2 % (ref 11.5–15.5)
WBC: 7.3 10*3/uL (ref 4.0–10.5)
WBC: 6.9 10*3/uL (ref 4.0–10.5)

## 2017-12-04 LAB — TROPONIN I: Troponin I: <0.03 ng/mL (ref <0.03)

## 2017-12-04 LAB — LIPID PANEL
Cholesterol: 173 mg/dL (ref 0–200)
HDL: 47 mg/dL (ref >40)
LDL CALC: 109 mg/dL — AB (ref 0–99)
Total CHOL/HDL Ratio: 3.7 RATIO
Triglycerides: 85 mg/dL (ref <150)
VLDL: 17 mg/dL (ref 0–40)

## 2017-12-04 LAB — BASIC METABOLIC PANEL
Anion gap: 10 (ref 5–15)
BUN: 9 mg/dL (ref 6–20)
CO2: 24 mmol/L (ref 22–32)
Calcium: 9.5 mg/dL (ref 8.9–10.3)
Chloride: 106 mmol/L (ref 98–111)
Creatinine, Ser: 0.72 mg/dL (ref 0.44–1.00)
GFR calc Af Amer: >60 mL/min (ref >60)
GFR calc non Af Amer: >60 mL/min (ref >60)
Glucose, Bld: 97 mg/dL (ref 70–99)
Potassium: 4.5 mmol/L (ref 3.5–5.1)
Sodium: 140 mmol/L (ref 135–145)

## 2017-12-04 LAB — CREATININE, SERUM: Creatinine, Ser: 0.71 mg/dL (ref 0.44–1.00)

## 2017-12-04 LAB — BRAIN NATRIURETIC PEPTIDE: B Natriuretic Peptide: 14.3 pg/mL (ref 0.0–100.0)

## 2017-12-04 LAB — MRSA PCR SCREENING: MRSA BY PCR: NEGATIVE

## 2017-12-04 LAB — HIV ANTIBODY (ROUTINE TESTING W REFLEX): HIV Screen 4th Generation wRfx: NONREACTIVE

## 2017-12-04 LAB — HEMOGLOBIN A1C
HEMOGLOBIN A1C: 5.6 % (ref 4.8–5.6)
MEAN PLASMA GLUCOSE: 114.02 mg/dL

## 2017-12-04 MED ORDER — INFLUENZA VAC SPLIT QUAD 0.5 ML IM SUSY: 0.5000 mL | PREFILLED_SYRINGE | INTRAMUSCULAR | Status: DC

## 2017-12-04 MED ORDER — HEPARIN SODIUM (PORCINE) 5000 UNIT/ML IJ SOLN
5000.0000 [IU] | Freq: Three times a day (TID) | INTRAMUSCULAR | Status: DC
  Administered 2017-12-04 – 2017-12-05 (×5): 5000 [IU] via SUBCUTANEOUS
  Filled 2017-12-04 (×5): qty 1

## 2017-12-04 MED ORDER — LISINOPRIL 20 MG PO TABS
30.0000 mg | ORAL_TABLET | Freq: Every day | ORAL | Status: DC
  Administered 2017-12-04 – 2017-12-06 (×3): 30 mg via ORAL
  Filled 2017-12-04 (×4): qty 1

## 2017-12-04 MED ORDER — ACETAMINOPHEN 325 MG PO TABS: 650.0000 mg | ORAL_TABLET | ORAL | Status: DC | PRN

## 2017-12-04 MED ORDER — ONDANSETRON HCL 4 MG/2ML IJ SOLN: 4.0000 mg | Freq: Four times a day (QID) | INTRAMUSCULAR | Status: DC | PRN

## 2017-12-04 MED ORDER — FAMOTIDINE 20 MG PO TABS
40.0000 mg | ORAL_TABLET | Freq: Two times a day (BID) | ORAL | Status: DC
  Administered 2017-12-04: 40 mg via ORAL
  Administered 2017-12-04: 20 mg via ORAL
  Administered 2017-12-04 – 2017-12-06 (×5): 40 mg via ORAL
  Filled 2017-12-04 (×8): qty 2

## 2017-12-04 MED ORDER — ASPIRIN EC 81 MG PO TBEC
81.0000 mg | DELAYED_RELEASE_TABLET | Freq: Every day | ORAL | Status: DC
  Administered 2017-12-05: 81 mg via ORAL
  Filled 2017-12-04 (×2): qty 1

## 2017-12-04 MED ORDER — ATORVASTATIN CALCIUM 20 MG PO TABS: 10.0000 mg | ORAL_TABLET | ORAL | Status: DC

## 2017-12-04 MED ORDER — METOPROLOL TARTRATE 12.5 MG HALF TABLET
12.5000 mg | ORAL_TABLET | Freq: Two times a day (BID) | ORAL | Status: DC
  Administered 2017-12-04 – 2017-12-06 (×5): 12.5 mg via ORAL
  Filled 2017-12-04 (×6): qty 1

## 2017-12-04 MED ORDER — ATORVASTATIN CALCIUM 40 MG PO TABS
40.0000 mg | ORAL_TABLET | Freq: Every day | ORAL | Status: DC
  Administered 2017-12-05 – 2017-12-11 (×4): 40 mg via ORAL
  Filled 2017-12-04 (×7): qty 1

## 2017-12-04 MED ORDER — DULOXETINE HCL 30 MG PO CPEP
30.0000 mg | ORAL_CAPSULE | Freq: Every day | ORAL | Status: DC
  Administered 2017-12-04 – 2017-12-11 (×7): 30 mg via ORAL
  Filled 2017-12-04 (×7): qty 1

## 2017-12-04 NOTE — H&P (Addendum)
Cardiology Admission History and Physical:   Patient ID: [name] MRN: [id number]; DOB: 29-Mar-1960   Admission date: 12/03/2017  Primary Care Provider: Vicenta Aly, [place] Primary Cardiologist: Burt Knack  Primary Electrophysiologist:  None   Chief Complaint:  Chest pain   Patient Profile:   [name] is a 57 y.o. female with HTN and HLD who is admitted after presenting with chest pain and is found to have severe CAD on cardiac CT FFR.  History of Present Illness:   [name] is a 57 y.o. female with HTN and HLD who is admitted after presenting with chest pain and is found to have severe CAD on cardiac CT FFR.  Please see consult note from 10/25 for full details. Briefly, the patient has no cardiac history and presented to the ED with an episode of chest pain. ED workup was reassuring, and the decision was made for cardiac CT FFR.   CT FFR was done this evening and preliminarily shows significant CAD. The decision was made for admission and LHC.   On my evaluation, the patient is resting comfortably in bed. She denies any recurrent CP or other symptoms.   Past Medical History:  Diagnosis Date  . Acid reflux   . Fibroid   . Osteopenia 10/2016   T score -2.1 FRAX's 5.7%/0.3%    Past Surgical History:  Procedure Laterality Date  . ABDOMINAL HYSTERECTOMY  2011   SUPRACERVICAL HYSTERECTOMY  . APPENDECTOMY    . CESAREAN SECTION     X3  . TONSILLECTOMY AND ADENOIDECTOMY       Medications Prior to Admission: Prior to Admission medications   Medication Sig Start Date End Date Taking? Authorizing Provider  atorvastatin (LIPITOR) 10 MG tablet Take 10 mg by mouth See admin instructions. Monday, Wednesday, Friday and Sunday   Yes [provider]  DULoxetine (CYMBALTA) 60 MG capsule Take 30 mg by mouth daily.    Yes [provider]  famotidine (PEPCID) 40 MG tablet Take 40 mg by mouth 2 (two) times daily.   Yes [provider]  lisinopril (PRINIVIL,ZESTRIL) 30 MG tablet Take 30 mg by mouth daily.   Yes [provider]     Allergies:    Allergies  Allergen Reactions  . Sulfa Antibiotics Swelling    Social History:   Social History   Socioeconomic History  . Marital status: Married    Spouse name: Not on file  . Number of children: Not on file  . Years of education: Not on file  . Highest education level: Not on file  Occupational History  . Not on file  Social Needs  . Financial resource strain: Not on file  . Food insecurity:    Worry: Not on file    Inability: Not on file  . Transportation needs:    Medical: Not on file    Non-medical: Not on file  Tobacco Use  . Smoking status: Former Smoker    Last attempt to quit: 09/08/1994    Years since quitting: 23.2  . Smokeless tobacco: Never Used  Substance and Sexual Activity  . Alcohol use: Yes    Alcohol/week: 0.0 standard drinks    Comment: occ  . Drug use: No  . Sexual activity: Yes  Lifestyle  . Physical activity:    Days per week: Not on file    Minutes per session: Not on file  . Stress: Not on file  Relationships  . Social connections:  Talks on phone: Not on file    Gets together: Not on file    Attends religious service: Not on file    Active member of club or organization: Not on file    Attends meetings of clubs or organizations: Not on file    Relationship status: Not on file  . Intimate partner violence:    Fear of current or ex partner: Not on file    Emotionally abused: Not on file    Physically abused: Not on file    Forced sexual activity: Not on file  Other Topics Concern  . Not on file  Social History Narrative  . Not on file    Family History:    The patient's family history includes Diabetes in her father and paternal grandfather; Heart disease in her father and mother; Hypertension in her father and mother.    ROS:  Please see the history of present illness.  All other ROS  reviewed and negative.     Physical Exam/Data:   Vitals:   12/03/17 2134 12/03/17 2215 12/03/17 2245 12/03/17 2315  BP:  117/80 120/82 (!) 137/99  Pulse: 68 62 (!) 59 69  Resp: 16 16 12 16   Temp:      TempSrc:      SpO2: 100% 96% 97% 97%   No intake or output data in the 24 hours ending 12/04/17 0036 There were no vitals filed for this visit. There is no height or weight on file to calculate BMI.  General:  Well nourished, well developed, in no acute distress  HEENT: normal Lymph: no adenopathy Neck: no JVD Cardiac:  normal S1, S2; RRR; no murmur  Lungs:  clear to auscultation bilaterally, no wheezing, rhonchi or rales  Abd: soft, nontender  Ext: noedema Musculoskeletal:  No deformities, BUE and BLE strength normal and equal Skin: warm and dry  Neuro:  No focal abnormalities noted Psych:  Normal affect     Relevant CV Studies: Cardiac CT FFR: Severe CAD. Formal report pending.   Laboratory Data:  Chemistry Recent Labs  Lab 12/03/17 1326  NA 139  K 3.4*  CL 107  CO2 22  GLUCOSE 97  BUN 14  CREATININE 0.76  CALCIUM 9.7  GFRNONAA >60  GFRAA >60  ANIONGAP 10    No results for input(s): PROT, ALBUMIN, AST, ALT, ALKPHOS, BILITOT in the last 168 hours. Hematology Recent Labs  Lab 12/03/17 1326  WBC 7.3  RBC 4.84  HGB 13.3  HCT 40.0  MCV 82.6  MCH 27.5  MCHC 33.3  RDW 13.0  PLT 389   Cardiac EnzymesNo results for input(s): TROPONINI in the last 168 hours.  Recent Labs  Lab 12/03/17 1345  TROPIPOC 0.00    BNPNo results for input(s): BNP, PROBNP in the last 168 hours.  DDimer No results for input(s): DDIMER in the last 168 hours.  Radiology/Studies:  Dg Chest 2 View  Result Date: 12/03/2017 CLINICAL DATA:  Chest pain. EXAM: CHEST - 2 VIEW COMPARISON:  None. FINDINGS: The heart size and pulmonary vascularity are normal. Aortic atherosclerosis. Minimal atelectasis at the lung bases. No effusions. No significant bone abnormality. IMPRESSION: Minimal  atelectasis at the lung bases. Electronically Signed   By: Lorriane Shire M.D.   On: 12/03/2017 14:19   Ct Coronary Morph W/cta Cor W/score W/ca W/cm &/or Wo/cm  Result Date: 12/03/2017 CLINICAL DATA:  Chest pain EXAM: Cardiac CTA MEDICATIONS: Sub lingual nitro. 4mg  x 2 TECHNIQUE: The patient was scanned on a Siemens  532 slice scanner. Gantry rotation speed was 250 msecs. Collimation was 0.6 mm. A 100 kV prospective scan was triggered in the ascending thoracic aorta at 35-75% of the R-R interval. Average HR during the scan was 60 bpm. The 3D data set was interpreted on a dedicated work station using MPR, MIP and VRT modes. A total of 80cc of contrast was used. FINDINGS: Non-cardiac: See separate report from [facility]. Pulmonary veins drain normally to the left atrium. Calcium Score: 648 Agatston units. Coronary Arteries: Right dominant with no anomalies LM: Calcified plaque distal left main, mild stenosis. LAD system: There is extensive mixed plaque in the proximal and mid LAD. There appears to be a 75-90% range ostial LAD stenosis followed by an ectatic segment in the proximal LAD. There is mild stenosis in the mid LAD. Circumflex system: Mixed plaque proximal LCx without significant stenosis. RCA system: Mixed plaque proximal-mid RCA with 75-90% range stenosis in the mid RCA. IMPRESSION: 1. Coronary artery calcium score 648 Agatston units, placing the patient in the 99th percentile for age and gender. This suggests high risk for future cardiac events. 2.  Moderate to severe stenosis in the mid RCA and the ostial LAD. Dalton Mclean Electronically Signed   By: Loralie Champagne M.D.   On: 12/03/2017 23:10    Assessment and Plan:   Chest pain CAD The patient presented with an episode of chest pain. ED workup not consistent with ACS. She has no prior known CAD but multiple risk factors. CT FFR demonstrated severe CAD for which she will need LHC. She remains chest pain free. -Will plan for LHC,  likely Monday unless she has recurrent symptoms. -Echocardiogram ordered  -Will continue to trend troponin -HgA1c, lipid panel ordered -Increasing atorvastatin to 40 mg daily (pt denies any prior intolerance) -Starting low dose metoprolol -Will order heparin gtt for recurrent CP or elevated troponin  HTN -Continue lisinopril -Adding metoprolol as above  HLD -Lipid panel ordered -Increasing atorvastatin as above  Severity of Illness: The appropriate patient status for this patient is INPATIENT. Inpatient status is judged to be reasonable and necessary in order to provide the required intensity of service to ensure the patient's safety. The patient's presenting symptoms, physical exam findings, and initial radiographic and laboratory data in the context of their chronic comorbidities is felt to place them at high risk for further clinical deterioration. Furthermore, it is not anticipated that the patient will be medically stable for discharge from the hospital within 2 midnights of admission. The following factors support the patient status of inpatient.   " The patient's presenting symptoms include chest pain . " The worrisome physical exam findings include n/a. " The initial radiographic and laboratory data are worrisome because of abnormal CT FFR. " The chronic co-morbidities include HTN.   * I certify that at the point of admission it is my clinical judgment that the patient will require inpatient hospital care spanning beyond 2 midnights from the point of admission due to high intensity of service, high risk for further deterioration and high frequency of surveillance required.*    For questions or updates, please contact [place] Please consult [email or web address] for contact info under        Signed, Nila Nephew, MD  12/04/2017 12:36 AM

## 2017-12-04 NOTE — ED Notes (Signed)
Pt resting on L side with eyes closed, RR even and unlabored, NAD. Family member sleeping at bedside.

## 2017-12-04 NOTE — Progress Notes (Addendum)
Progress Note  Patient Name: [name] Date of Encounter: 12/04/2017  Primary Cardiologist: Dr. Sherren Mocha, MD  Subjective   No recurrent chest pain. Pt prefers to stay the weekend in hopes to get cath done early Monday morning as opposed to leaving and coming back later in the week for cath.   Inpatient Medications    Scheduled Meds: . [START ON 12/05/2017] aspirin EC  81 mg Oral Daily  . atorvastatin  40 mg Oral q1800  . DULoxetine  30 mg Oral Daily  . famotidine  40 mg Oral BID  . heparin  5,000 Units Subcutaneous Q8H  . lisinopril  30 mg Oral Daily  . metoprolol tartrate  12.5 mg Oral BID   Continuous Infusions:  PRN Meds: acetaminophen, nitroGLYCERIN, ondansetron (ZOFRAN) IV   Vital Signs    Vitals:   12/04/17 0600 12/04/17 0615 12/04/17 0630 12/04/17 0730  BP: 123/82  104/73 115/70  Pulse: 63 (!) 52 60 (!) 59  Resp: 16 14 15 14   Temp:      TempSrc:      SpO2: 95% 98% 92% 96%   No intake or output data in the 24 hours ending 12/04/17 1221 There were no vitals filed for this visit.  Physical Exam   General: Well developed, well nourished, NAD Skin: Warm, dry, intact  Head: Normocephalic, atraumatic, clear, moist mucus membranes. Neck: Negative for carotid bruits. No JVD Lungs:Clear to ausculation bilaterally. No wheezes, rales, or rhonchi. Breathing is unlabored. Cardiovascular: RRR with S1 S2. No murmurs, rubs, gallops, or LV heave appreciated. Abdomen: Soft, non-tender, non-distended with normoactive bowel sounds.No obvious abdominal masses. MSK: Strength and tone appear normal for age. 5/5 in all extremities Extremities: No edema. No clubbing or cyanosis. DP/PT pulses 2+ bilaterally Neuro: Alert and oriented. No focal deficits. No facial asymmetry. MAE spontaneously. Psych: Responds to questions appropriately with normal affect.    Labs    Chemistry Recent Labs  Lab 12/03/17 1326 12/04/17 0110 12/04/17 0607  NA 139  --  140    K 3.4*  --  4.5  CL 107  --  106  CO2 22  --  24  GLUCOSE 97  --  97  BUN 14  --  9  CREATININE 0.76 0.71 0.72  CALCIUM 9.7  --  9.5  GFRNONAA >60 >60 >60  GFRAA >60 >60 >60  ANIONGAP 10  --  10     Hematology Recent Labs  Lab 12/03/17 1326 12/04/17 0110 12/04/17 0607  WBC 7.3 7.3 6.9  RBC 4.84 4.73 4.61  HGB 13.3 12.9 12.7  HCT 40.0 40.5 39.2  MCV 82.6 85.6 85.0  MCH 27.5 27.3 27.5  MCHC 33.3 31.9 32.4  RDW 13.0 13.3 13.2  PLT 389 365 347    Cardiac Enzymes Recent Labs  Lab 12/04/17 0110 12/04/17 0607  TROPONINI <0.03 <0.03    Recent Labs  Lab 12/03/17 1345  TROPIPOC 0.00     BNP Recent Labs  Lab 12/04/17 0110  BNP 14.3     DDimer No results for input(s): DDIMER in the last 168 hours.   Radiology    Dg Chest 2 View  Result Date: 12/03/2017 CLINICAL DATA:  Chest pain. EXAM: CHEST - 2 VIEW COMPARISON:  None. FINDINGS: The heart size and pulmonary vascularity are normal. Aortic atherosclerosis. Minimal atelectasis at the lung bases. No effusions. No significant bone abnormality. IMPRESSION: Minimal atelectasis at the lung bases. Electronically Signed   By: Jeneen Rinks  Maxwell M.D.   On: 12/03/2017 14:19   Ct Coronary Morph W/cta Cor W/score W/ca W/cm &/or Wo/cm  Addendum Date: 12/04/2017   ADDENDUM REPORT: 12/04/2017 09:20 ADDENDUM: OVER-READ INTERPRETATION  CT CHEST EXAM: OVER-READ INTERPRETATION CORONARY CTA The following report is an over-read performed by radiologist Dr. Barnetta Hammersmith [facility] on 12/04/2017. This over-read does not include interpretation of cardiac or coronary anatomy or pathology. The CTA interpretation by the cardiologist is attached. COMPARISON:  None. FINDINGS: Cardiovascular: Visualized segments of the thoracic aorta and pulmonary arteries are of normal caliber. Mediastinum/Nodes: No visualized lymphadenopathy the or masses. Lungs/Pleura: Mild scattered pulmonary scarring. Visualized lungs show no evidence of  pulmonary edema, consolidation, pneumothorax, nodule or pleural fluid. Chest Wall: No visualized abnormalities. Upper Abdomen: No visualized abnormalities. Musculoskeletal: No visualized abnormalities. IMPRESSION: No significant findings. Electronically Signed   By: Aletta Edouard M.D.   On: 12/04/2017 09:20   Result Date: 12/04/2017 CLINICAL DATA:  Chest pain EXAM: Cardiac CTA MEDICATIONS: Sub lingual nitro. 4mg  x 2 TECHNIQUE: The patient was scanned on a Siemens 211 slice scanner. Gantry rotation speed was 250 msecs. Collimation was 0.6 mm. A 100 kV prospective scan was triggered in the ascending thoracic aorta at 35-75% of the R-R interval. Average HR during the scan was 60 bpm. The 3D data set was interpreted on a dedicated work station using MPR, MIP and VRT modes. A total of 80cc of contrast was used. FINDINGS: Non-cardiac: See separate report from [facility]. Pulmonary veins drain normally to the left atrium. Calcium Score: 648 Agatston units. Coronary Arteries: Right dominant with no anomalies LM: Calcified plaque distal left main, mild stenosis. LAD system: There is extensive mixed plaque in the proximal and mid LAD. There appears to be a 75-90% range ostial LAD stenosis followed by an ectatic segment in the proximal LAD. There is mild stenosis in the mid LAD. Circumflex system: Mixed plaque proximal LCx without significant stenosis. RCA system: Mixed plaque proximal-mid RCA with 75-90% range stenosis in the mid RCA. IMPRESSION: 1. Coronary artery calcium score 648 Agatston units, placing the patient in the 99th percentile for age and gender. This suggests high risk for future cardiac events. 2.  Moderate to severe stenosis in the mid RCA and the ostial LAD. [place] Teaching laboratory technician Electronically Signed: By: Loralie Champagne M.D. On: 12/03/2017 23:10   Telemetry    12/04/2017 NSR- Personally Reviewed  ECG    No new tracings as of 12/04/2017- Personally Reviewed  Cardiac Studies    Echocardiogram: Pending  Coronary CT 12/03/2016:  IMPRESSION: 1. Coronary artery calcium score 648 Agatston units, placing the patient in the 99th percentile for age and gender. This suggests high risk for future cardiac events.  2.  Moderate to severe stenosis in the mid RCA and the ostial LAD.  Patient Profile     57 y.o. female with HTN and HLD who is admitted after presenting with chest pain and is found to have severe CAD on cardiac CT FFR.  Assessment & Plan    1.  Chest pain with hx of CAD per cardiac CT: -Patient presented to [facility] with complaints of chest pain -No prior history of CAD however multiple risk factors and abnormal CT with FFR which demonstrated severe CAD with coronary artery calcium score of 648 with moderate to severe stenosis in the mid RCA and ostial LAD>>> plan for cardiac catheterization further evaluation of coronary anatomy>> inpatient versus outpatient -Echocardiogram, -Troponin remains negative -Atorvastatin increased to 40 mg daily -Low-dose metoprolol  started on admission -Will discontinue heparin gtt>>>CE's negative  -MD to see for final recommendations for cardiac cath plans  2.  HTN: -Stable, 118/73, 115/70, 104/73 -Continue lisinopril, metoprolol  3.  HLD: -Stable, but elevated, LDL 109 -LDL goal <70 -Continue atorvastatin   Signed, Kathyrn Drown NP-C [facility] Pager: [phone] 12/04/2017, 12:21 PM     For questions or updates, please contact   Please consult [email or web address] for contact info under Cardiology/STEMI.  Patient seen and examined with the above-signed Advanced Practice Provider and/or Housestaff. I personally reviewed laboratory data, imaging studies and relevant notes. I independently examined the patient and formulated the important aspects of the plan. I have edited the note to reflect any of my changes or salient points. I have personally discussed the plan with the patient and/or family.  No further CP.  Coronary CTA reviewed personally and ostial LAD lesion looks quite tight. Will need cath Monday. Continue ASA and statin.   Glori Bickers, MD  2:32 PM

## 2017-12-05 ENCOUNTER — Inpatient Hospital Stay (HOSPITAL_COMMUNITY): Payer: Commercial Managed Care - PPO

## 2017-12-05 DIAGNOSIS — I200 Unstable angina: Secondary | ICD-10-CM

## 2017-12-05 DIAGNOSIS — I5030 Unspecified diastolic (congestive) heart failure: Secondary | ICD-10-CM

## 2017-12-05 LAB — ECHOCARDIOGRAM COMPLETE
HEIGHTINCHES: 62 in
WEIGHTICAEL: 2172.85 [oz_av]

## 2017-12-05 MED ORDER — SODIUM CHLORIDE 0.9 % WEIGHT BASED INFUSION
3.0000 mL/kg/h | INTRAVENOUS | Status: DC
  Administered 2017-12-06: 3 mL/kg/h via INTRAVENOUS

## 2017-12-05 MED ORDER — SODIUM CHLORIDE 0.9 % IV SOLN: 250.0000 mL | INTRAVENOUS | Status: DC | PRN

## 2017-12-05 MED ORDER — SODIUM CHLORIDE 0.9% FLUSH: 3.0000 mL | INTRAVENOUS | Status: DC | PRN

## 2017-12-05 MED ORDER — ASPIRIN 81 MG PO CHEW
81.0000 mg | CHEWABLE_TABLET | ORAL | Status: AC
  Administered 2017-12-06: 81 mg via ORAL
  Filled 2017-12-05: qty 1

## 2017-12-05 MED ORDER — SODIUM CHLORIDE 0.9 % WEIGHT BASED INFUSION
1.0000 mL/kg/h | INTRAVENOUS | Status: DC
  Administered 2017-12-06: 1 mL/kg/h via INTRAVENOUS

## 2017-12-05 MED ORDER — SODIUM CHLORIDE 0.9% FLUSH
3.0000 mL | Freq: Two times a day (BID) | INTRAVENOUS | Status: DC
  Administered 2017-12-05: 3 mL via INTRAVENOUS

## 2017-12-05 NOTE — H&P (View-Only) (Signed)
Patient ID: [name], female   DOB: Oct 24, 1960, 57 y.o.   MRN: [id number]     Progress Note  Patient Name: [name] Date of Encounter: 12/05/2017  Primary Cardiologist: Dr. Sherren Mocha, MD  Subjective   No recurrent chest pain. Pt prefers to stay the weekend in hopes to get cath done early Monday morning as opposed to leaving and coming back later in the week for cath.   CT FFR results have returned:  FFR 0.7 in mid LAD, suspect hemodynamically significant proximal LAD stenosis.  FFR not significant RCA or LCx.   Inpatient Medications    Scheduled Meds: . aspirin EC  81 mg Oral Daily  . atorvastatin  40 mg Oral q1800  . DULoxetine  30 mg Oral Daily  . famotidine  40 mg Oral BID  . heparin  5,000 Units Subcutaneous Q8H  . Influenza vac split quadrivalent PF  0.5 mL Intramuscular Tomorrow-1000  . lisinopril  30 mg Oral Daily  . metoprolol tartrate  12.5 mg Oral BID   Continuous Infusions:  PRN Meds: acetaminophen, nitroGLYCERIN, ondansetron (ZOFRAN) IV   Vital Signs    Vitals:   12/04/17 2000 12/05/17 0000 12/05/17 0814 12/05/17 1013  BP:   118/78 119/87  Pulse: 69 65 68 71  Resp: 14 15 13    Temp:   98 F (36.7 C)   TempSrc:   Oral   SpO2: 96% 96% 100%   Weight:      Height:        Intake/Output Summary (Last 24 hours) at 12/05/2017 1104 Last data filed at 12/05/2017 0900 Gross per 24 hour  Intake 600 ml  Output 0 ml  Net 600 ml   Filed Weights   12/04/17 1600  Weight: 61.6 kg    Physical Exam   General: NAD Neck: No JVD, no thyromegaly or thyroid nodule.  Lungs: Clear to auscultation bilaterally with normal respiratory effort. CV: Nondisplaced PMI.  Heart regular S1/S2, no S3/S4, no murmur.  No peripheral edema.   Abdomen: Soft, nontender, no hepatosplenomegaly, no distention.  Skin: Intact without lesions or rashes.  Neurologic: Alert and oriented x 3.  Psych: Normal affect. Extremities: No clubbing or cyanosis.    HEENT: Normal.   Labs    Chemistry Recent Labs  Lab 12/03/17 1326 12/04/17 0110 12/04/17 0607  NA 139  --  140  K 3.4*  --  4.5  CL 107  --  106  CO2 22  --  24  GLUCOSE 97  --  97  BUN 14  --  9  CREATININE 0.76 0.71 0.72  CALCIUM 9.7  --  9.5  GFRNONAA >60 >60 >60  GFRAA >60 >60 >60  ANIONGAP 10  --  10     Hematology Recent Labs  Lab 12/03/17 1326 12/04/17 0110 12/04/17 0607  WBC 7.3 7.3 6.9  RBC 4.84 4.73 4.61  HGB 13.3 12.9 12.7  HCT 40.0 40.5 39.2  MCV 82.6 85.6 85.0  MCH 27.5 27.3 27.5  MCHC 33.3 31.9 32.4  RDW 13.0 13.3 13.2  PLT 389 365 347    Cardiac Enzymes Recent Labs  Lab 12/04/17 0110 12/04/17 0607  TROPONINI <0.03 <0.03    Recent Labs  Lab 12/03/17 1345  TROPIPOC 0.00     BNP Recent Labs  Lab 12/04/17 0110  BNP 14.3     DDimer No results for input(s): DDIMER in the last 168 hours.   Radiology    Dg Chest  2 View  Result Date: 12/03/2017 CLINICAL DATA:  Chest pain. EXAM: CHEST - 2 VIEW COMPARISON:  None. FINDINGS: The heart size and pulmonary vascularity are normal. Aortic atherosclerosis. Minimal atelectasis at the lung bases. No effusions. No significant bone abnormality. IMPRESSION: Minimal atelectasis at the lung bases. Electronically Signed   By: Lorriane Shire M.D.   On: 12/03/2017 14:19   Ct Coronary Morph W/cta Cor W/score W/ca W/cm &/or Wo/cm  Addendum Date: 12/04/2017   ADDENDUM REPORT: 12/04/2017 09:20 ADDENDUM: OVER-READ INTERPRETATION  CT CHEST EXAM: OVER-READ INTERPRETATION CORONARY CTA The following report is an over-read performed by radiologist Dr. Barnetta Hammersmith [facility] on 12/04/2017. This over-read does not include interpretation of cardiac or coronary anatomy or pathology. The CTA interpretation by the cardiologist is attached. COMPARISON:  None. FINDINGS: Cardiovascular: Visualized segments of the thoracic aorta and pulmonary arteries are of normal caliber. Mediastinum/Nodes: No visualized  lymphadenopathy the or masses. Lungs/Pleura: Mild scattered pulmonary scarring. Visualized lungs show no evidence of pulmonary edema, consolidation, pneumothorax, nodule or pleural fluid. Chest Wall: No visualized abnormalities. Upper Abdomen: No visualized abnormalities. Musculoskeletal: No visualized abnormalities. IMPRESSION: No significant findings. Electronically Signed   By: Aletta Edouard M.D.   On: 12/04/2017 09:20   Result Date: 12/04/2017 CLINICAL DATA:  Chest pain EXAM: Cardiac CTA MEDICATIONS: Sub lingual nitro. 4mg  x 2 TECHNIQUE: The patient was scanned on a Siemens 086 slice scanner. Gantry rotation speed was 250 msecs. Collimation was 0.6 mm. A 100 kV prospective scan was triggered in the ascending thoracic aorta at 35-75% of the R-R interval. Average HR during the scan was 60 bpm. The 3D data set was interpreted on a dedicated work station using MPR, MIP and VRT modes. A total of 80cc of contrast was used. FINDINGS: Non-cardiac: See separate report from [facility]. Pulmonary veins drain normally to the left atrium. Calcium Score: 648 Agatston units. Coronary Arteries: Right dominant with no anomalies LM: Calcified plaque distal left main, mild stenosis. LAD system: There is extensive mixed plaque in the proximal and mid LAD. There appears to be a 75-90% range ostial LAD stenosis followed by an ectatic segment in the proximal LAD. There is mild stenosis in the mid LAD. Circumflex system: Mixed plaque proximal LCx without significant stenosis. RCA system: Mixed plaque proximal-mid RCA with 75-90% range stenosis in the mid RCA. IMPRESSION: 1. Coronary artery calcium score 648 Agatston units, placing the patient in the 99th percentile for age and gender. This suggests high risk for future cardiac events. 2.  Moderate to severe stenosis in the mid RCA and the ostial LAD. Caidon Foti Teaching laboratory technician Electronically Signed: By: Loralie Champagne M.D. On: 12/03/2017 23:10   Telemetry    12/04/2017 NSR-  Personally Reviewed  ECG    No new tracings as of 12/04/2017- Personally Reviewed  Cardiac Studies   Echocardiogram: Pending  Coronary CT 12/03/2016:  IMPRESSION: 1. Coronary artery calcium score 648 Agatston units, placing the patient in the 99th percentile for age and gender. This suggests high risk for future cardiac events.  2.  Moderate to severe stenosis in the mid RCA and the ostial LAD.  Patient Profile     57 y.o. female with HTN and HLD who is admitted after presenting with chest pain and is found to have severe CAD on cardiac CT FFR.  Assessment & Plan    1.  Chest pain with hx of CAD per cardiac CT: Concern for unstable angina.  No prior history of CAD however multiple risk factors  and abnormal CT which demonstrated severe CAD with coronary artery calcium score of 648 with moderate to severe stenosis in the mid RCA and ostial LAD.  FFR was reviewed today, suspect the ostial/proximal LAD stenosis is significant (FFR 0.7 in the mid LAD) but RCA does not appear significant by FFR.  Troponin negative. No further chest pain here.  - LHC scheduled tomorrow.  - Echocardiogram - Atorvastatin increased to 40 mg daily - Low-dose metoprolol started on admission - Continue ASA 81 daily.  2.  HTN: BP stable.  -Continue lisinopril, metoprolol 3.  HLD: LDL 109 -LDL goal <70 -Continue atorvastatin  Loralie Champagne, MD  11:04 AM

## 2017-12-05 NOTE — Progress Notes (Signed)
  Echocardiogram 2D Echocardiogram has been performed.  Merrie Roof F 12/05/2017, 2:36 PM

## 2017-12-05 NOTE — Progress Notes (Signed)
Patient ID: [name], female   DOB: 11-01-1960, 57 y.o.   MRN: [id number]     Progress Note  Patient Name: [name] Date of Encounter: 12/05/2017  Primary Cardiologist: Dr. Sherren Mocha, MD  Subjective   No recurrent chest pain. Pt prefers to stay the weekend in hopes to get cath done early Monday morning as opposed to leaving and coming back later in the week for cath.   CT FFR results have returned:  FFR 0.7 in mid LAD, suspect hemodynamically significant proximal LAD stenosis.  FFR not significant RCA or LCx.   Inpatient Medications    Scheduled Meds: . aspirin EC  81 mg Oral Daily  . atorvastatin  40 mg Oral q1800  . DULoxetine  30 mg Oral Daily  . famotidine  40 mg Oral BID  . heparin  5,000 Units Subcutaneous Q8H  . Influenza vac split quadrivalent PF  0.5 mL Intramuscular Tomorrow-1000  . lisinopril  30 mg Oral Daily  . metoprolol tartrate  12.5 mg Oral BID   Continuous Infusions:  PRN Meds: acetaminophen, nitroGLYCERIN, ondansetron (ZOFRAN) IV   Vital Signs    Vitals:   12/04/17 2000 12/05/17 0000 12/05/17 0814 12/05/17 1013  BP:   118/78 119/87  Pulse: 69 65 68 71  Resp: 14 15 13    Temp:   98 F (36.7 C)   TempSrc:   Oral   SpO2: 96% 96% 100%   Weight:      Height:        Intake/Output Summary (Last 24 hours) at 12/05/2017 1104 Last data filed at 12/05/2017 0900 Gross per 24 hour  Intake 600 ml  Output 0 ml  Net 600 ml   Filed Weights   12/04/17 1600  Weight: 61.6 kg    Physical Exam   General: NAD Neck: No JVD, no thyromegaly or thyroid nodule.  Lungs: Clear to auscultation bilaterally with normal respiratory effort. CV: Nondisplaced PMI.  Heart regular S1/S2, no S3/S4, no murmur.  No peripheral edema.   Abdomen: Soft, nontender, no hepatosplenomegaly, no distention.  Skin: Intact without lesions or rashes.  Neurologic: Alert and oriented x 3.  Psych: Normal affect. Extremities: No clubbing or cyanosis.    HEENT: Normal.   Labs    Chemistry Recent Labs  Lab 12/03/17 1326 12/04/17 0110 12/04/17 0607  NA 139  --  140  K 3.4*  --  4.5  CL 107  --  106  CO2 22  --  24  GLUCOSE 97  --  97  BUN 14  --  9  CREATININE 0.76 0.71 0.72  CALCIUM 9.7  --  9.5  GFRNONAA >60 >60 >60  GFRAA >60 >60 >60  ANIONGAP 10  --  10     Hematology Recent Labs  Lab 12/03/17 1326 12/04/17 0110 12/04/17 0607  WBC 7.3 7.3 6.9  RBC 4.84 4.73 4.61  HGB 13.3 12.9 12.7  HCT 40.0 40.5 39.2  MCV 82.6 85.6 85.0  MCH 27.5 27.3 27.5  MCHC 33.3 31.9 32.4  RDW 13.0 13.3 13.2  PLT 389 365 347    Cardiac Enzymes Recent Labs  Lab 12/04/17 0110 12/04/17 0607  TROPONINI <0.03 <0.03    Recent Labs  Lab 12/03/17 1345  TROPIPOC 0.00     BNP Recent Labs  Lab 12/04/17 0110  BNP 14.3     DDimer No results for input(s): DDIMER in the last 168 hours.   Radiology    Dg Chest  2 View  Result Date: 12/03/2017 CLINICAL DATA:  Chest pain. EXAM: CHEST - 2 VIEW COMPARISON:  None. FINDINGS: The heart size and pulmonary vascularity are normal. Aortic atherosclerosis. Minimal atelectasis at the lung bases. No effusions. No significant bone abnormality. IMPRESSION: Minimal atelectasis at the lung bases. Electronically Signed   By: Lorriane Shire M.D.   On: 12/03/2017 14:19   Ct Coronary Morph W/cta Cor W/score W/ca W/cm &/or Wo/cm  Addendum Date: 12/04/2017   ADDENDUM REPORT: 12/04/2017 09:20 ADDENDUM: OVER-READ INTERPRETATION  CT CHEST EXAM: OVER-READ INTERPRETATION CORONARY CTA The following report is an over-read performed by radiologist Dr. Barnetta Hammersmith [facility] on 12/04/2017. This over-read does not include interpretation of cardiac or coronary anatomy or pathology. The CTA interpretation by the cardiologist is attached. COMPARISON:  None. FINDINGS: Cardiovascular: Visualized segments of the thoracic aorta and pulmonary arteries are of normal caliber. Mediastinum/Nodes: No visualized  lymphadenopathy the or masses. Lungs/Pleura: Mild scattered pulmonary scarring. Visualized lungs show no evidence of pulmonary edema, consolidation, pneumothorax, nodule or pleural fluid. Chest Wall: No visualized abnormalities. Upper Abdomen: No visualized abnormalities. Musculoskeletal: No visualized abnormalities. IMPRESSION: No significant findings. Electronically Signed   By: Aletta Edouard M.D.   On: 12/04/2017 09:20   Result Date: 12/04/2017 CLINICAL DATA:  Chest pain EXAM: Cardiac CTA MEDICATIONS: Sub lingual nitro. 4mg  x 2 TECHNIQUE: The patient was scanned on a Siemens 283 slice scanner. Gantry rotation speed was 250 msecs. Collimation was 0.6 mm. A 100 kV prospective scan was triggered in the ascending thoracic aorta at 35-75% of the R-R interval. Average HR during the scan was 60 bpm. The 3D data set was interpreted on a dedicated work station using MPR, MIP and VRT modes. A total of 80cc of contrast was used. FINDINGS: Non-cardiac: See separate report from [facility]. Pulmonary veins drain normally to the left atrium. Calcium Score: 648 Agatston units. Coronary Arteries: Right dominant with no anomalies LM: Calcified plaque distal left main, mild stenosis. LAD system: There is extensive mixed plaque in the proximal and mid LAD. There appears to be a 75-90% range ostial LAD stenosis followed by an ectatic segment in the proximal LAD. There is mild stenosis in the mid LAD. Circumflex system: Mixed plaque proximal LCx without significant stenosis. RCA system: Mixed plaque proximal-mid RCA with 75-90% range stenosis in the mid RCA. IMPRESSION: 1. Coronary artery calcium score 648 Agatston units, placing the patient in the 99th percentile for age and gender. This suggests high risk for future cardiac events. 2.  Moderate to severe stenosis in the mid RCA and the ostial LAD. Dangela How Teaching laboratory technician Electronically Signed: By: Loralie Champagne M.D. On: 12/03/2017 23:10   Telemetry    12/04/2017 NSR-  Personally Reviewed  ECG    No new tracings as of 12/04/2017- Personally Reviewed  Cardiac Studies   Echocardiogram: Pending  Coronary CT 12/03/2016:  IMPRESSION: 1. Coronary artery calcium score 648 Agatston units, placing the patient in the 99th percentile for age and gender. This suggests high risk for future cardiac events.  2.  Moderate to severe stenosis in the mid RCA and the ostial LAD.  Patient Profile     57 y.o. female with HTN and HLD who is admitted after presenting with chest pain and is found to have severe CAD on cardiac CT FFR.  Assessment & Plan    1.  Chest pain with hx of CAD per cardiac CT: Concern for unstable angina.  No prior history of CAD however multiple risk factors  and abnormal CT which demonstrated severe CAD with coronary artery calcium score of 648 with moderate to severe stenosis in the mid RCA and ostial LAD.  FFR was reviewed today, suspect the ostial/proximal LAD stenosis is significant (FFR 0.7 in the mid LAD) but RCA does not appear significant by FFR.  Troponin negative. No further chest pain here.  - LHC scheduled tomorrow.  - Echocardiogram - Atorvastatin increased to 40 mg daily - Low-dose metoprolol started on admission - Continue ASA 81 daily.  2.  HTN: BP stable.  -Continue lisinopril, metoprolol 3.  HLD: LDL 109 -LDL goal <70 -Continue atorvastatin  Loralie Champagne, MD  11:04 AM

## 2017-12-06 ENCOUNTER — Encounter (HOSPITAL_COMMUNITY): Payer: Commercial Managed Care - PPO

## 2017-12-06 ENCOUNTER — Encounter (HOSPITAL_COMMUNITY): Payer: Self-pay | Admitting: Cardiovascular Disease

## 2017-12-06 ENCOUNTER — Inpatient Hospital Stay (HOSPITAL_COMMUNITY): Payer: Commercial Managed Care - PPO

## 2017-12-06 ENCOUNTER — Other Ambulatory Visit: Payer: Self-pay

## 2017-12-06 ENCOUNTER — Ambulatory Visit (HOSPITAL_COMMUNITY): Payer: Commercial Managed Care - PPO

## 2017-12-06 ENCOUNTER — Encounter (HOSPITAL_COMMUNITY)
Admission: EM | Disposition: A | Discharge status: Home / Self Care | Payer: Self-pay | Source: Home / Self Care | Attending: Cardiothoracic Surgery

## 2017-12-06 DIAGNOSIS — Z01810 Encounter for preprocedural cardiovascular examination: Secondary | ICD-10-CM

## 2017-12-06 DIAGNOSIS — I25110 Atherosclerotic heart disease of native coronary artery with unstable angina pectoris: Secondary | ICD-10-CM

## 2017-12-06 DIAGNOSIS — I10 Essential (primary) hypertension: Secondary | ICD-10-CM

## 2017-12-06 DIAGNOSIS — I25119 Atherosclerotic heart disease of native coronary artery with unspecified angina pectoris: Secondary | ICD-10-CM

## 2017-12-06 HISTORY — PX: LEFT HEART CATH AND CORONARY ANGIOGRAPHY: CATH118249

## 2017-12-06 LAB — URINALYSIS, ROUTINE W REFLEX MICROSCOPIC
Bacteria, UA: NONE SEEN
Bilirubin Urine: NEGATIVE
Glucose, UA: NEGATIVE mg/dL
Hgb urine dipstick: NEGATIVE
Ketones, ur: NEGATIVE mg/dL
Leukocytes, UA: NEGATIVE
Nitrite: NEGATIVE
Protein, ur: NEGATIVE mg/dL
Specific Gravity, Urine: 1.023 (ref 1.005–1.030)
pH: 5 (ref 5.0–8.0)

## 2017-12-06 LAB — PULMONARY FUNCTION TEST
FEF 25-75 Post: 2.11 L/sec
FEF 25-75 Pre: 1.61 L/sec
FEF2575-%Change-Post: 30 %
FEF2575-%Pred-Post: 89 %
FEF2575-%Pred-Pre: 68 %
FEV1-%Change-Post: 10 %
FEV1-%Pred-Post: 94 %
FEV1-%Pred-Pre: 85 %
FEV1-Post: 2.31 L
FEV1-Pre: 2.09 L
FEV1FVC-%Change-Post: 5 %
FEV1FVC-%Pred-Pre: 94 %
FEV6-%Change-Post: 1 %
FEV6-%Pred-Post: 93 %
FEV6-%Pred-Pre: 92 %
FEV6-Post: 2.86 L
FEV6-Pre: 2.81 L
FEV6FVC-%Pred-Post: 103 %
FEV6FVC-%Pred-Pre: 103 %
FVC-%Change-Post: 4 %
FVC-%Pred-Post: 93 %
FVC-%Pred-Pre: 89 %
FVC-Post: 2.93 L
FVC-Pre: 2.81 L
Post FEV1/FVC ratio: 79 %
Post FEV6/FVC ratio: 100 %
Pre FEV1/FVC ratio: 74 %
Pre FEV6/FVC Ratio: 100 %

## 2017-12-06 LAB — SURGICAL PCR SCREEN
MRSA, PCR: NEGATIVE
STAPHYLOCOCCUS AUREUS: NEGATIVE

## 2017-12-06 LAB — PREPARE RBC (CROSSMATCH)

## 2017-12-06 LAB — PROTIME-INR
INR: 1.02
Prothrombin Time: 13.3 seconds (ref 11.4–15.2)

## 2017-12-06 LAB — HEMOGLOBIN A1C
Hgb A1c MFr Bld: 5.6 % (ref 4.8–5.6)
Mean Plasma Glucose: 114.02 mg/dL

## 2017-12-06 LAB — ABO/RH: ABO/RH(D): AB POS

## 2017-12-06 SURGERY — LEFT HEART CATH AND CORONARY ANGIOGRAPHY
Anesthesia: LOCAL

## 2017-12-06 MED ORDER — MIDAZOLAM HCL 2 MG/2ML IJ SOLN
INTRAMUSCULAR | Status: DC | PRN
  Administered 2017-12-06: 1 mg via INTRAVENOUS

## 2017-12-06 MED ORDER — EPINEPHRINE PF 1 MG/ML IJ SOLN
0.0000 ug/min | INTRAVENOUS | Status: DC
  Filled 2017-12-06: qty 4

## 2017-12-06 MED ORDER — SODIUM CHLORIDE 0.9 % IV SOLN
750.0000 mg | INTRAVENOUS | Status: DC
  Filled 2017-12-06: qty 750

## 2017-12-06 MED ORDER — DEXMEDETOMIDINE HCL IN NACL 400 MCG/100ML IV SOLN
0.1000 ug/kg/h | INTRAVENOUS | Status: DC
  Filled 2017-12-06: qty 100

## 2017-12-06 MED ORDER — NITROGLYCERIN IN D5W 200-5 MCG/ML-% IV SOLN
2.0000 ug/min | INTRAVENOUS | Status: AC
  Administered 2017-12-07: 16.6 ug/min via INTRAVENOUS
  Filled 2017-12-06: qty 250

## 2017-12-06 MED ORDER — SODIUM CHLORIDE 0.9 % IV SOLN
1.5000 g | INTRAVENOUS | Status: AC
  Administered 2017-12-07: .75 g via INTRAVENOUS
  Administered 2017-12-07: 1.5 g via INTRAVENOUS
  Filled 2017-12-06: qty 1.5

## 2017-12-06 MED ORDER — MIDAZOLAM HCL 2 MG/2ML IJ SOLN
INTRAMUSCULAR | Status: AC
  Filled 2017-12-06: qty 2

## 2017-12-06 MED ORDER — DIAZEPAM 2 MG PO TABS
2.0000 mg | ORAL_TABLET | Freq: Once | ORAL | Status: AC
  Administered 2017-12-07: 2 mg via ORAL
  Filled 2017-12-06: qty 1

## 2017-12-06 MED ORDER — ALBUTEROL SULFATE (2.5 MG/3ML) 0.083% IN NEBU
2.5000 mg | INHALATION_SOLUTION | Freq: Once | RESPIRATORY_TRACT | Status: AC
  Administered 2017-12-06: 2.5 mg via RESPIRATORY_TRACT

## 2017-12-06 MED ORDER — LIDOCAINE HCL (PF) 1 % IJ SOLN
INTRAMUSCULAR | Status: AC
  Filled 2017-12-06: qty 30

## 2017-12-06 MED ORDER — IOHEXOL 350 MG/ML SOLN
INTRAVENOUS | Status: DC | PRN
  Administered 2017-12-06: 85 mL

## 2017-12-06 MED ORDER — CHLORHEXIDINE GLUCONATE 4 % EX LIQD: 60.0000 mL | Freq: Once | CUTANEOUS | Status: DC

## 2017-12-06 MED ORDER — FENTANYL CITRATE (PF) 100 MCG/2ML IJ SOLN
INTRAMUSCULAR | Status: AC
  Filled 2017-12-06: qty 2

## 2017-12-06 MED ORDER — NOREPINEPHRINE 4 MG/250ML-% IV SOLN: 0.0000 ug/min | INTRAVENOUS | Status: DC

## 2017-12-06 MED ORDER — TEMAZEPAM 15 MG PO CAPS: 15.0000 mg | ORAL_CAPSULE | Freq: Once | ORAL | Status: DC | PRN

## 2017-12-06 MED ORDER — VERAPAMIL HCL 2.5 MG/ML IV SOLN
INTRAVENOUS | Status: DC | PRN
  Administered 2017-12-06: 10 mL via INTRA_ARTERIAL

## 2017-12-06 MED ORDER — SODIUM CHLORIDE 0.9 % IV SOLN: 250.0000 mL | INTRAVENOUS | Status: DC | PRN

## 2017-12-06 MED ORDER — CHLORHEXIDINE GLUCONATE 4 % EX LIQD
60.0000 mL | Freq: Once | CUTANEOUS | Status: AC
  Administered 2017-12-06: 4 via TOPICAL
  Filled 2017-12-06: qty 60

## 2017-12-06 MED ORDER — CHLORHEXIDINE GLUCONATE 4 % EX LIQD
60.0000 mL | Freq: Once | CUTANEOUS | Status: AC
  Administered 2017-12-07: 4 via TOPICAL
  Filled 2017-12-06: qty 60

## 2017-12-06 MED ORDER — BISACODYL 5 MG PO TBEC: 5.0000 mg | DELAYED_RELEASE_TABLET | Freq: Once | ORAL | Status: DC

## 2017-12-06 MED ORDER — SODIUM CHLORIDE 0.9 % IV SOLN
INTRAVENOUS | Status: DC
  Filled 2017-12-06: qty 30

## 2017-12-06 MED ORDER — SODIUM CHLORIDE 0.9 % IV SOLN: INTRAVENOUS | Status: AC

## 2017-12-06 MED ORDER — HEPARIN (PORCINE) IN NACL 1000-0.9 UT/500ML-% IV SOLN
INTRAVENOUS | Status: DC | PRN
  Administered 2017-12-06 (×2): 500 mL

## 2017-12-06 MED ORDER — TRANEXAMIC ACID (OHS) BOLUS VIA INFUSION
15.0000 mg/kg | INTRAVENOUS | Status: AC
  Administered 2017-12-07: 924 mg via INTRAVENOUS
  Filled 2017-12-06: qty 924

## 2017-12-06 MED ORDER — METOPROLOL TARTRATE 12.5 MG HALF TABLET
12.5000 mg | ORAL_TABLET | Freq: Once | ORAL | Status: AC
  Administered 2017-12-07: 12.5 mg via ORAL
  Filled 2017-12-06: qty 1

## 2017-12-06 MED ORDER — TRANEXAMIC ACID (OHS) PUMP PRIME SOLUTION
2.0000 mg/kg | INTRAVENOUS | Status: DC
  Filled 2017-12-06: qty 1.23

## 2017-12-06 MED ORDER — NOREPINEPHRINE 4 MG/250ML-% IV SOLN
0.0000 ug/min | INTRAVENOUS | Status: DC
  Filled 2017-12-06: qty 250

## 2017-12-06 MED ORDER — VANCOMYCIN HCL 10 G IV SOLR
1250.0000 mg | INTRAVENOUS | Status: AC
  Administered 2017-12-07: 1250 mg via INTRAVENOUS
  Filled 2017-12-06 (×2): qty 1250

## 2017-12-06 MED ORDER — MILRINONE LACTATE IN DEXTROSE 20-5 MG/100ML-% IV SOLN
0.3000 ug/kg/min | INTRAVENOUS | Status: DC
  Filled 2017-12-06: qty 100

## 2017-12-06 MED ORDER — PLASMA-LYTE 148 IV SOLN
INTRAVENOUS | Status: AC
  Administered 2017-12-07: 500 mL
  Filled 2017-12-06: qty 2.5

## 2017-12-06 MED ORDER — SODIUM CHLORIDE 0.9% FLUSH
3.0000 mL | Freq: Two times a day (BID) | INTRAVENOUS | Status: DC
  Administered 2017-12-06: 3 mL via INTRAVENOUS

## 2017-12-06 MED ORDER — MAGNESIUM SULFATE 50 % IJ SOLN
40.0000 meq | INTRAMUSCULAR | Status: DC
  Filled 2017-12-06: qty 9.85

## 2017-12-06 MED ORDER — POTASSIUM CHLORIDE 2 MEQ/ML IV SOLN
80.0000 meq | INTRAVENOUS | Status: DC
  Filled 2017-12-06: qty 40

## 2017-12-06 MED ORDER — HEPARIN (PORCINE) IN NACL 1000-0.9 UT/500ML-% IV SOLN
INTRAVENOUS | Status: AC
  Filled 2017-12-06: qty 1000

## 2017-12-06 MED ORDER — PHENYLEPHRINE HCL-NACL 20-0.9 MG/250ML-% IV SOLN
30.0000 ug/min | INTRAVENOUS | Status: DC
  Filled 2017-12-06: qty 250

## 2017-12-06 MED ORDER — HEPARIN SODIUM (PORCINE) 1000 UNIT/ML IJ SOLN
INTRAMUSCULAR | Status: DC | PRN
  Administered 2017-12-06: 4000 [IU] via INTRAVENOUS

## 2017-12-06 MED ORDER — DOPAMINE-DEXTROSE 3.2-5 MG/ML-% IV SOLN
0.0000 ug/kg/min | INTRAVENOUS | Status: DC
  Filled 2017-12-06: qty 250

## 2017-12-06 MED ORDER — CHLORHEXIDINE GLUCONATE 0.12 % MT SOLN
15.0000 mL | Freq: Once | OROMUCOSAL | Status: AC
  Administered 2017-12-07: 15 mL via OROMUCOSAL
  Filled 2017-12-06: qty 15

## 2017-12-06 MED ORDER — LIDOCAINE HCL (PF) 1 % IJ SOLN
INTRAMUSCULAR | Status: DC | PRN
  Administered 2017-12-06: 2 mL

## 2017-12-06 MED ORDER — ALPRAZOLAM 0.25 MG PO TABS
0.2500 mg | ORAL_TABLET | ORAL | Status: DC | PRN
  Administered 2017-12-06: 0.5 mg via ORAL
  Filled 2017-12-06: qty 2

## 2017-12-06 MED ORDER — FENTANYL CITRATE (PF) 100 MCG/2ML IJ SOLN
INTRAMUSCULAR | Status: DC | PRN
  Administered 2017-12-06: 25 ug via INTRAVENOUS

## 2017-12-06 MED ORDER — SODIUM CHLORIDE 0.9% FLUSH: 3.0000 mL | INTRAVENOUS | Status: DC | PRN

## 2017-12-06 MED ORDER — TRANEXAMIC ACID 1000 MG/10ML IV SOLN
1.5000 mg/kg/h | INTRAVENOUS | Status: DC
  Filled 2017-12-06: qty 25

## 2017-12-06 MED ORDER — INSULIN REGULAR(HUMAN) IN NACL 100-0.9 UT/100ML-% IV SOLN
INTRAVENOUS | Status: DC
  Filled 2017-12-06: qty 100

## 2017-12-06 SURGICAL SUPPLY — 11 items
CATH 5FR JL3.5 JR4 ANG PIG MP (CATHETERS) ×2 IMPLANT
DEVICE RAD TR BAND REGULAR (VASCULAR PRODUCTS) ×2 IMPLANT
GLIDESHEATH SLEND SS 6F .021 (SHEATH) ×2 IMPLANT
GUIDEWIRE INQWIRE 1.5J.035X260 (WIRE) ×1 IMPLANT
INQWIRE 1.5J .035X260CM (WIRE) ×2
KIT HEART LEFT (KITS) ×2 IMPLANT
PACK CARDIAC CATHETERIZATION (CUSTOM PROCEDURE TRAY) ×2 IMPLANT
SYR MEDRAD MARK V 150ML (SYRINGE) ×2 IMPLANT
TRANSDUCER W/STOPCOCK (MISCELLANEOUS) ×2 IMPLANT
TUBING CIL FLEX 10 FLL-RA (TUBING) ×2 IMPLANT
WIRE HI TORQ VERSACORE-J 145CM (WIRE) ×2 IMPLANT

## 2017-12-06 NOTE — Consult Note (Signed)
[address]       [place] [address]             [phone]        Dominga G Florence Medical Record #[id number] Date of Birth: Jun 06, 1960  Referring: No ref. provider found Primary Care: Vicenta Aly, FNP Primary Cardiologist:Brian Bettina Gavia, MD  Chief Complaint:    Chief Complaint  Patient presents with  . Chest Pain   Patient examined, images of coronary angiogram and echocardiogram personally reviewed and counseled with patient.  History of Present Illness:     Very nice 57 year old female diagnosed today with severe three-vessel coronary disease.  She was admitted to the hospital 72 hours ago after episode of emotional stress and presyncope.  She had associated chest pain.  She is brought to the ED your troponin was negative.  She underwent a cardiac CT followed by cardiac catheterization.  This demonstrated three-vessel coronary disease with 80% stenosis of the proximal LAD, 80% stenosis of the distal circumflex and 80% stenosis of the dominant RCA.  EF is normal by echo and ventriculogram.  LVEDP is normal.  No valvular disease on echo.  Chest x-ray is clear.  Preoperative PFTs are normal.  Preoperative carotid Doppler showed no significant stenosis.  The patient's risk factors include positive family history-both parents had CABG, hypertension, and mildly elevated LDL 110.  The patient denies previous thoracic trauma, pneumothorax, no history of smoking.  Patient has had previous general anesthesia for appendectomy and hysterectomy without complication.  The patient's cardiologist, Dr. Angelena Form has recommended coronary artery bypass grafting is her best long-term therapy due to the coronary anatomy and I agree with that recommendation.  Patient has been scheduled for surgery a.m. October 29.  Current Activity/ Functional Status: Excellent functional status preoperatively-the patient is married and works full-time as a Software engineer.     Zubrod Score: At the time of surgery this patient's most appropriate activity status/level should be described as: []     0    Normal activity, no symptoms [x]     1    Restricted in physical strenuous activity but ambulatory, able to do out light work []     2    Ambulatory and capable of self care, unable to do work activities, up and about                 more than 50%  Of the time                            []     3    Only limited self care, in bed greater than 50% of waking hours []     4    Completely disabled, no self care, confined to bed or chair []     5    Moribund  Past Medical History:  Diagnosis Date  . Acid reflux   . Fibroid   . Osteopenia 10/2016   T score -2.1 FRAX's 5.7%/0.3%    Past Surgical History:  Procedure Laterality Date  . ABDOMINAL HYSTERECTOMY  2011   SUPRACERVICAL HYSTERECTOMY  . APPENDECTOMY    . CESAREAN SECTION     X3  . LEFT HEART CATH AND CORONARY ANGIOGRAPHY N/A 12/06/2017   Procedure: LEFT HEART CATH AND CORONARY ANGIOGRAPHY;  Surgeon: Burnell Blanks, MD;  Location: [facility];  Service: Cardiovascular;  Laterality: N/A;  . TONSILLECTOMY AND ADENOIDECTOMY  Social History   Tobacco Use  Smoking Status Former Smoker  . Last attempt to quit: 09/08/1994  . Years since quitting: 23.2  Smokeless Tobacco Never Used    Social History   Substance and Sexual Activity  Alcohol Use Yes  . Alcohol/week: 0.0 standard drinks   Comment: occ     Allergies  Allergen Reactions  . Sulfa Antibiotics Swelling    Current Facility-Administered Medications  Medication Dose Route Frequency Provider Last Rate Last Dose  . 0.9 %  sodium chloride infusion  250 mL Intravenous PRN Burnell Blanks, MD      . acetaminophen (TYLENOL) tablet 650 mg  650 mg Oral Q4H PRN Burnell Blanks, MD      . ALPRAZolam Duanne Moron) tablet 0.25-0.5 mg  0.25-0.5 mg Oral Q4H PRN Ivin Poot, MD      . aspirin EC tablet 81 mg  81 mg Oral Daily  Burnell Blanks, MD   81 mg at 12/05/17 1013  . atorvastatin (LIPITOR) tablet 40 mg  40 mg Oral q1800 Burnell Blanks, MD   40 mg at 12/05/17 1746  . bisacodyl (DULCOLAX) EC tablet 5 mg  5 mg Oral Once Prescott Gum, Collier Salina, MD      . Derrill Memo ON 12/07/2017] cefUROXime (ZINACEF) 1.5 g in sodium chloride 0.9 % 100 mL IVPB  1.5 g Intravenous To OR Prescott Gum, Collier Salina, MD      . Derrill Memo ON 12/07/2017] cefUROXime (ZINACEF) 750 mg in sodium chloride 0.9 % 100 mL IVPB  750 mg Intravenous To OR Prescott Gum, Collier Salina, MD      . chlorhexidine (HIBICLENS) 4 % liquid 4 application  60 mL Topical Once Prescott Gum, Collier Salina, MD       And  . Derrill Memo ON 12/07/2017] chlorhexidine (HIBICLENS) 4 % liquid 4 application  60 mL Topical Once Prescott Gum, Collier Salina, MD      . Derrill Memo ON 12/07/2017] chlorhexidine (PERIDEX) 0.12 % solution 15 mL  15 mL Mouth/Throat Once Prescott Gum, Collier Salina, MD      . Derrill Memo ON 12/07/2017] dexmedetomidine (PRECEDEX) 400 MCG/100ML (4 mcg/mL) infusion  0.1-0.7 mcg/kg/hr Intravenous To OR Prescott Gum, Collier Salina, MD      . Derrill Memo ON 12/07/2017] diazepam (VALIUM) tablet 2 mg  2 mg Oral Once Prescott Gum, Collier Salina, MD      . Derrill Memo ON 12/07/2017] DOPamine (INTROPIN) 800 mg in dextrose 5 % 250 mL (3.2 mg/mL) infusion  0-10 mcg/kg/min Intravenous To OR Prescott Gum, Collier Salina, MD      . DULoxetine (CYMBALTA) DR capsule 30 mg  30 mg Oral Daily Burnell Blanks, MD   30 mg at 12/06/17 0946  . [START ON 12/07/2017] EPINEPHrine (ADRENALIN) 4 mg in dextrose 5 % 250 mL (0.016 mg/mL) infusion  0-10 mcg/min Intravenous To OR Prescott Gum, Collier Salina, MD      . famotidine (PEPCID) tablet 40 mg  40 mg Oral BID Burnell Blanks, MD   40 mg at 12/06/17 0942  . [START ON 12/07/2017] heparin 2,500 Units, papaverine 30 mg in electrolyte-148 (PLASMALYTE-148) 500 mL irrigation   Irrigation To OR Prescott Gum, Collier Salina, MD      . Derrill Memo ON 12/07/2017] heparin 30,000 units/NS 1000 mL solution for CELLSAVER   Other To OR Prescott Gum, Collier Salina, MD      .  Influenza vac split quadrivalent PF (FLUARIX) injection 0.5 mL  0.5 mL Intramuscular Tomorrow-1000 Burnell Blanks, MD      . Derrill Memo ON 12/07/2017] insulin regular, human (  MYXREDLIN) 100 units/ 100 mL infusion   Intravenous To OR Prescott Gum, Collier Salina, MD      . lisinopril (PRINIVIL,ZESTRIL) tablet 30 mg  30 mg Oral Daily Burnell Blanks, MD   30 mg at 12/06/17 0945  . [START ON 12/07/2017] magnesium sulfate (IV Push/IM) injection 40 mEq  40 mEq Other To OR Prescott Gum, Collier Salina, MD      . metoprolol tartrate (LOPRESSOR) tablet 12.5 mg  12.5 mg Oral BID Burnell Blanks, MD   12.5 mg at 12/06/17 0946  . [START ON 12/07/2017] metoprolol tartrate (LOPRESSOR) tablet 12.5 mg  12.5 mg Oral Once Prescott Gum, Collier Salina, MD      . Derrill Memo ON 12/07/2017] milrinone (PRIMACOR) 20 MG/100 ML (0.2 mg/mL) infusion  0.3 mcg/kg/min Intravenous To OR Prescott Gum, Collier Salina, MD      . nitroGLYCERIN (NITROSTAT) SL tablet 0.4 mg  0.4 mg Sublingual Q5 min PRN Burnell Blanks, MD   0.8 mg at 12/03/17 2158  . [START ON 12/07/2017] nitroGLYCERIN 50 mg in dextrose 5 % 250 mL (0.2 mg/mL) infusion  2-200 mcg/min Intravenous To OR Prescott Gum, Collier Salina, MD      . Derrill Memo ON 12/07/2017] norepinephrine (LEVOPHED) 4mg  in D5W 252mL premix infusion  0-40 mcg/min Intravenous Titrated Prescott Gum, Collier Salina, MD      . ondansetron [facility]) injection 4 mg  4 mg Intravenous Q6H PRN Burnell Blanks, MD      . Derrill Memo ON 12/07/2017] phenylephrine (NEOSYNEPHRINE) 20-0.9 MG/250ML-% infusion  30-200 mcg/min Intravenous To OR Prescott Gum, Collier Salina, MD      . Derrill Memo ON 12/07/2017] potassium chloride injection 80 mEq  80 mEq Other To OR Prescott Gum, Collier Salina, MD      . sodium chloride flush (NS) 0.9 % injection 3 mL  3 mL Intravenous Q12H Burnell Blanks, MD      . sodium chloride flush (NS) 0.9 % injection 3 mL  3 mL Intravenous PRN Burnell Blanks, MD      . temazepam (RESTORIL) capsule 15 mg  15 mg Oral Once PRN Prescott Gum, Collier Salina, MD       . Derrill Memo ON 12/07/2017] tranexamic acid (CYKLOKAPRON) 2,500 mg in sodium chloride 0.9 % 250 mL (10 mg/mL) infusion  1.5 mg/kg/hr Intravenous To OR Prescott Gum, Collier Salina, MD      . Derrill Memo ON 12/07/2017] tranexamic acid (CYKLOKAPRON) bolus via infusion - over 30 minutes 924 mg  15 mg/kg Intravenous To OR Prescott Gum, Collier Salina, MD      . Derrill Memo ON 12/07/2017] tranexamic acid (CYKLOKAPRON) pump prime solution 123 mg  2 mg/kg Intracatheter To OR Prescott Gum, Collier Salina, MD      . Derrill Memo ON 12/07/2017] vancomycin (VANCOCIN) 1,250 mg in sodium chloride 0.9 % 250 mL IVPB  1,250 mg Intravenous To OR Ivin Poot, MD        Medications Prior to Admission  Medication Sig Dispense Refill Last Dose  . atorvastatin (LIPITOR) 10 MG tablet Take 10 mg by mouth See admin instructions. Monday, Wednesday, Friday and Sunday   12/01/2017  . DULoxetine (CYMBALTA) 60 MG capsule Take 30 mg by mouth daily.    12/03/2017 at Unknown time  . famotidine (PEPCID) 40 MG tablet Take 40 mg by mouth 2 (two) times daily.   Past Week at Unknown time  . lisinopril (PRINIVIL,ZESTRIL) 30 MG tablet Take 30 mg by mouth daily.   12/03/2017 at Unknown time    Family History  Problem Relation Age of Onset  .  Hypertension Mother   . Heart disease Mother   . Diabetes Father   . Hypertension Father   . Heart disease Father   . Diabetes Paternal Grandfather      Review of Systems:   ROS      Cardiac Review of Systems: Y or  [    ]= no  Chest Pain [ y   ]  Resting SOB [   ] Exertional SOB  [  ]  Orthopnea [  ]   Pedal Edema [   ]    Palpitations Blue.Reese  ] Syncope  [  ]   Presyncope [ y  ]  General Review of Systems: [Y] = yes [  ]=no Constitional: recent weight change [  ]; anorexia [  ]; fatigue [  ]; nausea [  ]; night sweats [  ]; fever [  ]; or chills [  ]                                                               Dental: Last Dentist visit:25yr   Eye : blurred vision [  ]; diplopia [   ]; vision changes [  ];  Amaurosis fugax[   ]; Resp: cough [  ];  wheezing[  ];  hemoptysis[  ]; shortness of breath[  ]; paroxysmal nocturnal dyspnea[  ]; dyspnea on exertion[  ]; or orthopnea[  ];  GI:  gallstones[  ], vomiting[  ];  dysphagia[  ]; melena[  ];  hematochezia [  ]; heartburn[ y ];   Hx of  Colonoscopy[  ]; GU: kidney stones [  ]; hematuria[  ];   dysuria [  ];  nocturia[  ];  history of     obstruction [  ]; urinary frequency [  ]             Skin: rash, swelling[  ];, hair loss[  ];  peripheral edema[  ];  or itching[  ]; Musculosketetal: myalgias[  ];  joint swelling[  ];  joint erythema[  ];  joint pain[  ];  back pain[  ];  Heme/Lymph: bruising[  ];  bleeding[  ];  anemia[  ];  Neuro: TIA[  ];  headaches[  ];  stroke[  ];  vertigo[  ];  seizures[  ];   paresthesias[  ];  difficulty walking[  ];  Psych:depression[  ]; anxiety[  ];  Endocrine: diabetes[  ];  thyroid dysfunction[  ];               Physical Exam: BP 127/73 (BP Location: Left Arm)   Pulse 61   Temp 98.2 F (36.8 C) (Oral)   Resp 14   Ht 5\' 2"  (1.575 m)   Wt 61.6 kg   SpO2 95%   BMI 24.84 kg/m         Exam    General- alert and comfortable    Neck- no JVD, no cervical adenopathy palpable, no carotid bruit   Lungs- clear without rales, wheezes   Cor- regular rate and rhythm, no murmur , gallop   Abdomen- soft, non-tender   Extremities - warm, non-tender, minimal edema.  No bleeding or hematoma at cardiac catheterization site   Neuro- oriented, appropriate, no focal weakness  Diagnostic Studies & Laboratory data:     Recent Radiology Findings:  Chest x-ray clear   I have independently reviewed the above radiologic studies and discussed with the patient   Recent Lab Findings: Lab Results  Component Value Date   WBC 6.9 12/04/2017   HGB 12.7 12/04/2017   HCT 39.2 12/04/2017   PLT 347 12/04/2017   GLUCOSE 97 12/04/2017   CHOL 173 12/04/2017   TRIG 85 12/04/2017   HDL 47 12/04/2017   LDLCALC 109 (H) 12/04/2017   NA 140  12/04/2017   K 4.5 12/04/2017   CL 106 12/04/2017   CREATININE 0.72 12/04/2017   BUN 9 12/04/2017   CO2 24 12/04/2017   INR 1.02 12/06/2017   HGBA1C 5.6 12/06/2017      Assessment / Plan:      Unstable angina Severe three-vessel coronary artery disease Preserved LV systolic function History of hypertension, family history of CABG, and mild dyslipidemia  I have discussed the procedure of CABG in detail with the patient and her husband including expected benefits and potential risks.  She understands the risk of bleeding, transfusion, stroke, postoperative infection, postoperative pulmonary problems including pleural effusion, postoperative arrhythmia, organ failure, and death   She agrees to proceed with surgery.     @ME1 @ 12/06/2017 5:47 PM

## 2017-12-06 NOTE — Interval H&P Note (Signed)
History and Physical Interval Note:  12/06/2017 7:39 AM  Kamorah G [name]  has presented today for cardiac cath with the diagnosis of unstable angina/CAD on coronary CTA.  The various methods of treatment have been discussed with the patient and family. After consideration of risks, benefits and other options for treatment, the patient has consented to  Procedure(s): LEFT HEART CATH AND CORONARY ANGIOGRAPHY (N/A) as a surgical intervention .  The patient's history has been reviewed, patient examined, no change in status, stable for surgery.  I have reviewed the patient's chart and labs.  Questions were answered to the patient's satisfaction.    Cath Lab Visit (complete for each Cath Lab visit)  Clinical Evaluation Leading to the Procedure:   ACS: No.  Non-ACS:    Anginal Classification: CCS III  Anti-ischemic medical therapy: No Therapy  Non-Invasive Test Results: No non-invasive testing performed  Prior CABG: No previous CABG         Lauree Chandler

## 2017-12-06 NOTE — Progress Notes (Signed)
Progress Note  Patient Name: [name] Date of Encounter: 12/06/2017  Primary Cardiologist: Shirlee More, MD   Subjective   No chest pain this am.   Inpatient Medications    Scheduled Meds: . [MAR Hold] aspirin EC  81 mg Oral Daily  . [MAR Hold] atorvastatin  40 mg Oral q1800  . [MAR Hold] DULoxetine  30 mg Oral Daily  . [MAR Hold] famotidine  40 mg Oral BID  . [MAR Hold] heparin  5,000 Units Subcutaneous Q8H  . [MAR Hold] Influenza vac split quadrivalent PF  0.5 mL Intramuscular Tomorrow-1000  . [MAR Hold] lisinopril  30 mg Oral Daily  . [MAR Hold] metoprolol tartrate  12.5 mg Oral BID  . sodium chloride flush  3 mL Intravenous Q12H   Continuous Infusions: . sodium chloride    . sodium chloride 1 mL/kg/hr (12/06/17 0523)   PRN Meds: sodium chloride, [MAR Hold] acetaminophen, [MAR Hold] nitroGLYCERIN, [MAR Hold] ondansetron (ZOFRAN) IV, sodium chloride flush   Vital Signs    Vitals:   12/06/17 0757 12/06/17 0802 12/06/17 0807 12/06/17 0812  BP: 128/80 134/80 (!) 143/88 (!) 143/89  Pulse: 67 71 77 (!) 268  Resp: 13 12 13    Temp:      TempSrc:      SpO2: 97% 97% 98% (!) 0%  Weight:      Height:        Intake/Output Summary (Last 24 hours) at 12/06/2017 0827 Last data filed at 12/05/2017 1500 Gross per 24 hour  Intake 480 ml  Output 0 ml  Net 480 ml   Filed Weights   12/04/17 1600  Weight: 61.6 kg    Telemetry    sinus - Personally Reviewed  ECG      Physical Exam   GEN: No acute distress.   Neck: No JVD Cardiac: RRR, no murmurs, rubs, or gallops.  Respiratory: Clear to auscultation bilaterally. GI: Soft, nontender, non-distended  MS: No edema; No deformity. Neuro:  Nonfocal  Psych: Normal affect   Labs    Chemistry Recent Labs  Lab 12/03/17 1326 12/04/17 0110 12/04/17 0607  NA 139  --  140  K 3.4*  --  4.5  CL 107  --  106  CO2 22  --  24  GLUCOSE 97  --  97  BUN 14  --  9  CREATININE 0.76 0.71 0.72  CALCIUM 9.7   --  9.5  GFRNONAA >60 >60 >60  GFRAA >60 >60 >60  ANIONGAP 10  --  10     Hematology Recent Labs  Lab 12/03/17 1326 12/04/17 0110 12/04/17 0607  WBC 7.3 7.3 6.9  RBC 4.84 4.73 4.61  HGB 13.3 12.9 12.7  HCT 40.0 40.5 39.2  MCV 82.6 85.6 85.0  MCH 27.5 27.3 27.5  MCHC 33.3 31.9 32.4  RDW 13.0 13.3 13.2  PLT 389 365 347    Cardiac Enzymes Recent Labs  Lab 12/04/17 0110 12/04/17 0607  TROPONINI <0.03 <0.03    Recent Labs  Lab 12/03/17 1345  TROPIPOC 0.00     BNP Recent Labs  Lab 12/04/17 0110  BNP 14.3     DDimer No results for input(s): DDIMER in the last 168 hours.   Radiology    No results found.  Cardiac Studies     Patient Profile     57 y.o. female with HTN and HLD admitted with unstable angina and found to have multi-vessel CAD on Coronary CTA. Cardiac cath 12/06/17 with  severe three vessel CAD  Assessment & Plan    1. CAD with unstable angina: Three vessel CAD on cath today with aneurysmal segments in the RCA and LAD making PCI unfavorable. Will ask CT surgery to see her to discuss CABG. Will continue ASA and statin.   2. HTN: BP is controlled this am  3. Hyperlipidemia: Continue statin.   For questions or updates, please contact Arthur Please consult [email or web address] for contact info under        Signed, Lauree Chandler, MD  12/06/2017, 8:27 AM

## 2017-12-06 NOTE — Progress Notes (Signed)
CARDIAC REHAB PHASE I   Pt has yet to talk to surgeon but asked for ed to be done. Brief pre-op ed completed with pt and family. Pt educated on importance of IS and sternal precautions. Pt given in-the-tube sheet, OHS care guide, Cardiac surgery booklet, and IS. Transport arrived to take pt to vascular lab. Will continue to follow.  [id number] Rufina Falco, RN BSN 12/06/2017 2:30 PM

## 2017-12-06 NOTE — Progress Notes (Signed)
Pre-CABG testing has been completed. 1-39% ICA stenosis bilaterally.  ABI's  Right 1.25 Left 1.3  Palmar waveforms Right Palmar waveforms are obliterated with radial and ulnar compression. Left  Palmar waveforms are obliterated with radial and ulnar compression.  12/06/17 3:28 PM [name] RVT

## 2017-12-06 NOTE — Anesthesia Preprocedure Evaluation (Addendum)
Anesthesia Evaluation  Patient identified by MRN, date of birth, ID band Patient awake    Reviewed: Allergy & Precautions, NPO status , Patient's Chart, lab work & pertinent test results, reviewed documented beta blocker date and time   History of Anesthesia Complications Negative for: history of anesthetic complications  Airway Mallampati: II  TM Distance: >3 FB Neck ROM: Full    Dental no notable dental hx. (+) Dental Advisory Given, Teeth Intact   Pulmonary neg pulmonary ROS, former smoker,    Pulmonary exam normal        Cardiovascular hypertension, Pt. on medications + angina + CAD  Normal cardiovascular exam  1. Severe triple vessel CAD 2. The left main artery has mild distal stenosis 3. The LAD is a large caliber vessel that courses around the apex. The proximal LAD is moderately calcified. The proximal LAD has a severe stenosis followed by a large aneurysmal segment then another severe calcific stenosis. The diagonal branches are small in caliber.  4. The Circumflex artery has a severe stenosis just distal to a moderate to large caliber obtuse marginal branch. The  Mid Circumflex has an aneurysmal segment.  5. The RCA is a large dominant vessel with moderate ostial stenosis and severe mid stenosis. 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  6. Normal LV systolic function  Recommendations: She has three vessel CAD with aneurysmal segments in all three vessels making PCI difficult. I think CABG would be the favorable approach for revascularization. I will ask CT surgery to see her. Case reviewed with Dr. Haroldine Laws in the cath lab.     Neuro/Psych negative neurological ROS  negative psych ROS   GI/Hepatic Neg liver ROS, GERD  Medicated and Controlled,  Endo/Other  negative endocrine ROS  Renal/GU negative Renal ROS     Musculoskeletal negative musculoskeletal ROS (+)   Abdominal   Peds  Hematology negative hematology  ROS (+)   Anesthesia Other Findings Day of surgery medications reviewed with the patient.  Reproductive/Obstetrics                           Anesthesia Physical Anesthesia Plan  ASA: IV  Anesthesia Plan: General   Post-op Pain Management:    Induction: Intravenous  PONV Risk Score and Plan: 4 or greater and Ondansetron, Dexamethasone, Diphenhydramine and Treatment may vary due to age or medical condition  Airway Management Planned: Oral ETT  Additional Equipment: 3D TEE, Arterial line, PA Cath and Ultrasound Guidance Line Placement  Intra-op Plan:   Post-operative Plan: Post-operative intubation/ventilation  Informed Consent: I have reviewed the patients History and Physical, chart, labs and discussed the procedure including the risks, benefits and alternatives for the proposed anesthesia with the patient or authorized representative who has indicated his/her understanding and acceptance.   Dental advisory given  Plan Discussed with: CRNA, Anesthesiologist and Surgeon  Anesthesia Plan Comments:        Anesthesia Quick Evaluation

## 2017-12-07 ENCOUNTER — Inpatient Hospital Stay (HOSPITAL_COMMUNITY): Payer: Commercial Managed Care - PPO

## 2017-12-07 ENCOUNTER — Inpatient Hospital Stay (HOSPITAL_COMMUNITY): Payer: Commercial Managed Care - PPO | Admitting: Certified Registered Nurse Anesthetist

## 2017-12-07 ENCOUNTER — Encounter (HOSPITAL_COMMUNITY)
Admission: EM | Disposition: A | Discharge status: Home / Self Care | Payer: Self-pay | Source: Home / Self Care | Attending: Cardiothoracic Surgery

## 2017-12-07 ENCOUNTER — Encounter (HOSPITAL_COMMUNITY): Payer: Self-pay | Admitting: Certified Registered Nurse Anesthetist

## 2017-12-07 DIAGNOSIS — Z951 Presence of aortocoronary bypass graft: Secondary | ICD-10-CM

## 2017-12-07 HISTORY — PX: ENDOVEIN HARVEST OF GREATER SAPHENOUS VEIN: SHX5059

## 2017-12-07 HISTORY — PX: CORONARY ARTERY BYPASS GRAFT: SHX141

## 2017-12-07 HISTORY — PX: TEE WITHOUT CARDIOVERSION: SHX5443

## 2017-12-07 LAB — POCT I-STAT, CHEM 8
BUN: 11 mg/dL (ref 6–20)
BUN: 10 mg/dL (ref 6–20)
BUN: 8 mg/dL (ref 6–20)
BUN: 9 mg/dL (ref 6–20)
BUN: 9 mg/dL (ref 6–20)
BUN: 9 mg/dL (ref 6–20)
CALCIUM ION: 1.03 mmol/L — AB (ref 1.15–1.40)
CHLORIDE: 105 mmol/L (ref 98–111)
CHLORIDE: 108 mmol/L (ref 98–111)
CHLORIDE: 104 mmol/L (ref 98–111)
CREATININE: 0.5 mg/dL (ref 0.44–1.00)
CREATININE: 0.3 mg/dL — AB (ref 0.44–1.00)
CREATININE: 0.4 mg/dL — AB (ref 0.44–1.00)
CREATININE: 0.4 mg/dL — AB (ref 0.44–1.00)
Calcium, Ion: 1.22 mmol/L (ref 1.15–1.40)
Calcium, Ion: 0.9 mmol/L — ABNORMAL LOW (ref 1.15–1.40)
Calcium, Ion: 1.22 mmol/L (ref 1.15–1.40)
Calcium, Ion: 0.85 mmol/L — CL (ref 1.15–1.40)
Calcium, Ion: 1.18 mmol/L (ref 1.15–1.40)
Chloride: 101 mmol/L (ref 98–111)
Chloride: 106 mmol/L (ref 98–111)
Chloride: 108 mmol/L (ref 98–111)
Creatinine, Ser: 0.4 mg/dL — ABNORMAL LOW (ref 0.44–1.00)
Creatinine, Ser: 0.5 mg/dL (ref 0.44–1.00)
GLUCOSE: 91 mg/dL (ref 70–99)
GLUCOSE: 142 mg/dL — AB (ref 70–99)
Glucose, Bld: 93 mg/dL (ref 70–99)
Glucose, Bld: 112 mg/dL — ABNORMAL HIGH (ref 70–99)
Glucose, Bld: 128 mg/dL — ABNORMAL HIGH (ref 70–99)
Glucose, Bld: 132 mg/dL — ABNORMAL HIGH (ref 70–99)
HCT: 26 % — ABNORMAL LOW (ref 36.0–46.0)
HEMATOCRIT: 32 % — AB (ref 36.0–46.0)
HEMATOCRIT: 20 % — AB (ref 36.0–46.0)
HEMATOCRIT: 28 % — AB (ref 36.0–46.0)
HEMATOCRIT: 26 % — AB (ref 36.0–46.0)
HEMATOCRIT: 29 % — AB (ref 36.0–46.0)
HEMOGLOBIN: 9.5 g/dL — AB (ref 12.0–15.0)
HEMOGLOBIN: 9.9 g/dL — AB (ref 12.0–15.0)
Hemoglobin: 10.9 g/dL — ABNORMAL LOW (ref 12.0–15.0)
Hemoglobin: 6.8 g/dL — CL (ref 12.0–15.0)
Hemoglobin: 8.8 g/dL — ABNORMAL LOW (ref 12.0–15.0)
Hemoglobin: 8.8 g/dL — ABNORMAL LOW (ref 12.0–15.0)
POTASSIUM: 3.6 mmol/L (ref 3.5–5.1)
POTASSIUM: 3.8 mmol/L (ref 3.5–5.1)
POTASSIUM: 3.9 mmol/L (ref 3.5–5.1)
POTASSIUM: 5.1 mmol/L (ref 3.5–5.1)
Potassium: 3.9 mmol/L (ref 3.5–5.1)
Potassium: 4.7 mmol/L (ref 3.5–5.1)
SODIUM: 141 mmol/L (ref 135–145)
SODIUM: 141 mmol/L (ref 135–145)
Sodium: 141 mmol/L (ref 135–145)
Sodium: 141 mmol/L (ref 135–145)
Sodium: 142 mmol/L (ref 135–145)
Sodium: 138 mmol/L (ref 135–145)
TCO2: 26 mmol/L (ref 22–32)
TCO2: 26 mmol/L (ref 22–32)
TCO2: 27 mmol/L (ref 22–32)
TCO2: 25 mmol/L (ref 22–32)
TCO2: 23 mmol/L (ref 22–32)
TCO2: 23 mmol/L (ref 22–32)

## 2017-12-07 LAB — POCT I-STAT 3, ART BLOOD GAS (G3+)
ACID-BASE DEFICIT: 2 mmol/L (ref 0.0–2.0)
ACID-BASE DEFICIT: 4 mmol/L — AB (ref 0.0–2.0)
ACID-BASE DEFICIT: 5 mmol/L — AB (ref 0.0–2.0)
Acid-base deficit: 1 mmol/L (ref 0.0–2.0)
Acid-base deficit: 4 mmol/L — ABNORMAL HIGH (ref 0.0–2.0)
Acid-base deficit: 6 mmol/L — ABNORMAL HIGH (ref 0.0–2.0)
BICARBONATE: 23.1 mmol/L (ref 20.0–28.0)
BICARBONATE: 21.4 mmol/L (ref 20.0–28.0)
Bicarbonate: 21.4 mmol/L (ref 20.0–28.0)
Bicarbonate: 21.9 mmol/L (ref 20.0–28.0)
Bicarbonate: 22.2 mmol/L (ref 20.0–28.0)
Bicarbonate: 21.4 mmol/L (ref 20.0–28.0)
O2 SAT: 92 %
O2 SAT: 95 %
O2 Saturation: 100 %
O2 Saturation: 100 %
O2 Saturation: 95 %
O2 Saturation: 92 %
PCO2 ART: 31.4 mmHg — AB (ref 32.0–48.0)
PCO2 ART: 36.7 mmHg (ref 32.0–48.0)
PCO2 ART: 46.3 mmHg (ref 32.0–48.0)
PH ART: 7.304 — AB (ref 7.350–7.450)
PO2 ART: 401 mmHg — AB (ref 83.0–108.0)
PO2 ART: 248 mmHg — AB (ref 83.0–108.0)
PO2 ART: 62 mmHg — AB (ref 83.0–108.0)
PO2 ART: 72 mmHg — AB (ref 83.0–108.0)
Patient temperature: 35.5
Patient temperature: 36.4
Patient temperature: 36.2
TCO2: 24 mmol/L (ref 22–32)
TCO2: 22 mmol/L (ref 22–32)
TCO2: 23 mmol/L (ref 22–32)
TCO2: 23 mmol/L (ref 22–32)
TCO2: 24 mmol/L (ref 22–32)
TCO2: 23 mmol/L (ref 22–32)
pCO2 arterial: 31.4 mmHg — ABNORMAL LOW (ref 32.0–48.0)
pCO2 arterial: 43.8 mmHg (ref 32.0–48.0)
pCO2 arterial: 49.1 mmHg — ABNORMAL HIGH (ref 32.0–48.0)
pH, Arterial: 7.475 — ABNORMAL HIGH (ref 7.350–7.450)
pH, Arterial: 7.443 (ref 7.350–7.450)
pH, Arterial: 7.367 (ref 7.350–7.450)
pH, Arterial: 7.259 — ABNORMAL LOW (ref 7.350–7.450)
pH, Arterial: 7.269 — ABNORMAL LOW (ref 7.350–7.450)
pO2, Arterial: 81 mmHg — ABNORMAL LOW (ref 83.0–108.0)
pO2, Arterial: 82 mmHg — ABNORMAL LOW (ref 83.0–108.0)

## 2017-12-07 LAB — POCT I-STAT 4, (NA,K, GLUC, HGB,HCT)
Glucose, Bld: 111 mg/dL — ABNORMAL HIGH (ref 70–99)
HCT: 32 % — ABNORMAL LOW (ref 36.0–46.0)
HEMOGLOBIN: 10.9 g/dL — AB (ref 12.0–15.0)
POTASSIUM: 3.7 mmol/L (ref 3.5–5.1)
SODIUM: 143 mmol/L (ref 135–145)

## 2017-12-07 LAB — GLUCOSE, CAPILLARY
GLUCOSE-CAPILLARY: 120 mg/dL — AB (ref 70–99)
GLUCOSE-CAPILLARY: 113 mg/dL — AB (ref 70–99)
GLUCOSE-CAPILLARY: 111 mg/dL — AB (ref 70–99)
GLUCOSE-CAPILLARY: 134 mg/dL — AB (ref 70–99)
Glucose-Capillary: 115 mg/dL — ABNORMAL HIGH (ref 70–99)
Glucose-Capillary: 149 mg/dL — ABNORMAL HIGH (ref 70–99)

## 2017-12-07 LAB — CBC
HCT: 36.9 % (ref 36.0–46.0)
HCT: 33.5 % — ABNORMAL LOW (ref 36.0–46.0)
Hemoglobin: 12 g/dL (ref 12.0–15.0)
Hemoglobin: 10.7 g/dL — ABNORMAL LOW (ref 12.0–15.0)
MCH: 27 pg (ref 26.0–34.0)
MCH: 27.4 pg (ref 26.0–34.0)
MCHC: 32.5 g/dL (ref 30.0–36.0)
MCHC: 31.9 g/dL (ref 30.0–36.0)
MCV: 83.1 fL (ref 80.0–100.0)
MCV: 85.9 fL (ref 80.0–100.0)
NRBC: 0 % (ref 0.0–0.2)
PLATELETS: 290 10*3/uL (ref 150–400)
Platelets: 165 10*3/uL (ref 150–400)
RBC: 4.44 MIL/uL (ref 3.87–5.11)
RBC: 3.9 MIL/uL (ref 3.87–5.11)
RDW: 13.2 % (ref 11.5–15.5)
RDW: 13.6 % (ref 11.5–15.5)
WBC: 7.9 10*3/uL (ref 4.0–10.5)
WBC: 12.9 10*3/uL — ABNORMAL HIGH (ref 4.0–10.5)
nRBC: 0 % (ref 0.0–0.2)

## 2017-12-07 LAB — APTT: APTT: 28 s (ref 24–36)

## 2017-12-07 LAB — BASIC METABOLIC PANEL
ANION GAP: 5 (ref 5–15)
BUN: 14 mg/dL (ref 6–20)
CALCIUM: 9 mg/dL (ref 8.9–10.3)
CO2: 24 mmol/L (ref 22–32)
CREATININE: 0.68 mg/dL (ref 0.44–1.00)
Chloride: 110 mmol/L (ref 98–111)
GFR calc Af Amer: >60 mL/min (ref >60)
GFR calc non Af Amer: >60 mL/min (ref >60)
Glucose, Bld: 99 mg/dL (ref 70–99)
Potassium: 3.6 mmol/L (ref 3.5–5.1)
Sodium: 139 mmol/L (ref 135–145)

## 2017-12-07 LAB — PROTIME-INR
INR: 1.38
PROTHROMBIN TIME: 16.8 s — AB (ref 11.4–15.2)

## 2017-12-07 LAB — HEMOGLOBIN AND HEMATOCRIT, BLOOD
HCT: 28.9 % — ABNORMAL LOW (ref 36.0–46.0)
Hemoglobin: 9.6 g/dL — ABNORMAL LOW (ref 12.0–15.0)

## 2017-12-07 LAB — PLATELET COUNT: Platelets: 213 10*3/uL (ref 150–400)

## 2017-12-07 LAB — CREATININE, SERUM
Creatinine, Ser: 0.6 mg/dL (ref 0.44–1.00)
GFR calc Af Amer: >60 mL/min (ref >60)
GFR calc non Af Amer: >60 mL/min (ref >60)

## 2017-12-07 LAB — PREPARE RBC (CROSSMATCH)

## 2017-12-07 LAB — MAGNESIUM: Magnesium: 3.5 mg/dL — ABNORMAL HIGH (ref 1.7–2.4)

## 2017-12-07 SURGERY — CORONARY ARTERY BYPASS GRAFTING (CABG)
Anesthesia: General | Site: Leg Lower | Laterality: Right

## 2017-12-07 MED ORDER — EPHEDRINE 5 MG/ML INJ
INTRAVENOUS | Status: AC
  Filled 2017-12-07: qty 10

## 2017-12-07 MED ORDER — ALBUMIN HUMAN 5 % IV SOLN
INTRAVENOUS | Status: DC | PRN
  Administered 2017-12-07: 13:00:00 via INTRAVENOUS

## 2017-12-07 MED ORDER — INSULIN REGULAR(HUMAN) IN NACL 100-0.9 UT/100ML-% IV SOLN: INTRAVENOUS | Status: DC

## 2017-12-07 MED ORDER — SODIUM CHLORIDE 0.45 % IV SOLN
INTRAVENOUS | Status: DC | PRN
  Administered 2017-12-07: 14:00:00 via INTRAVENOUS

## 2017-12-07 MED ORDER — MIDAZOLAM HCL 10 MG/2ML IJ SOLN
INTRAMUSCULAR | Status: AC
  Filled 2017-12-07: qty 2

## 2017-12-07 MED ORDER — SODIUM CHLORIDE 0.9 % IV SOLN: 250.0000 mL | INTRAVENOUS | Status: DC

## 2017-12-07 MED ORDER — LACTATED RINGERS IV SOLN
INTRAVENOUS | Status: DC | PRN
  Administered 2017-12-07: 07:00:00 via INTRAVENOUS

## 2017-12-07 MED ORDER — MORPHINE SULFATE (PF) 2 MG/ML IV SOLN
2.0000 mg | INTRAVENOUS | Status: DC | PRN
  Administered 2017-12-08: 2 mg via INTRAVENOUS
  Filled 2017-12-07: qty 1

## 2017-12-07 MED ORDER — MORPHINE SULFATE (PF) 2 MG/ML IV SOLN
1.0000 mg | INTRAVENOUS | Status: DC | PRN
  Administered 2017-12-07 (×2): 2 mg via INTRAVENOUS
  Filled 2017-12-07 (×2): qty 1

## 2017-12-07 MED ORDER — OXYCODONE HCL 5 MG PO TABS
5.0000 mg | ORAL_TABLET | ORAL | Status: DC | PRN
  Administered 2017-12-07 – 2017-12-08 (×3): 5 mg via ORAL
  Filled 2017-12-07: qty 1
  Filled 2017-12-07: qty 2
  Filled 2017-12-07: qty 1

## 2017-12-07 MED ORDER — LACTATED RINGERS IV SOLN: INTRAVENOUS | Status: DC

## 2017-12-07 MED ORDER — METOPROLOL TARTRATE 12.5 MG HALF TABLET
12.5000 mg | ORAL_TABLET | Freq: Two times a day (BID) | ORAL | Status: DC
  Administered 2017-12-08 – 2017-12-09 (×3): 12.5 mg via ORAL
  Filled 2017-12-07 (×4): qty 1

## 2017-12-07 MED ORDER — HEPARIN SODIUM (PORCINE) 1000 UNIT/ML IJ SOLN: INTRAMUSCULAR | Status: DC | PRN

## 2017-12-07 MED ORDER — SODIUM CHLORIDE 0.9 % IJ SOLN
INTRAMUSCULAR | Status: AC
  Filled 2017-12-07: qty 30

## 2017-12-07 MED ORDER — ONDANSETRON HCL 4 MG/2ML IJ SOLN
4.0000 mg | Freq: Four times a day (QID) | INTRAMUSCULAR | Status: DC | PRN
  Administered 2017-12-07: 4 mg via INTRAVENOUS
  Filled 2017-12-07: qty 2

## 2017-12-07 MED ORDER — VANCOMYCIN HCL IN DEXTROSE 1-5 GM/200ML-% IV SOLN
1000.0000 mg | Freq: Once | INTRAVENOUS | Status: AC
  Administered 2017-12-07: 1000 mg via INTRAVENOUS
  Filled 2017-12-07: qty 200

## 2017-12-07 MED ORDER — TRANEXAMIC ACID 1000 MG/10ML IV SOLN
INTRAVENOUS | Status: DC | PRN
  Administered 2017-12-07: 1.5 mg/kg/h via INTRAVENOUS

## 2017-12-07 MED ORDER — ACETAMINOPHEN 160 MG/5ML PO SOLN: 650.0000 mg | Freq: Once | ORAL | Status: AC

## 2017-12-07 MED ORDER — CHLORHEXIDINE GLUCONATE 0.12 % MT SOLN
15.0000 mL | OROMUCOSAL | Status: AC
  Administered 2017-12-07: 15 mL via OROMUCOSAL

## 2017-12-07 MED ORDER — ASPIRIN EC 325 MG PO TBEC
325.0000 mg | DELAYED_RELEASE_TABLET | Freq: Every day | ORAL | Status: DC
  Administered 2017-12-08: 325 mg via ORAL
  Filled 2017-12-07 (×3): qty 1

## 2017-12-07 MED ORDER — NITROGLYCERIN 0.2 MG/ML ON CALL CATH LAB
INTRAVENOUS | Status: DC | PRN
  Administered 2017-12-07 (×2): 80 ug via INTRAVENOUS
  Administered 2017-12-07: 40 ug via INTRAVENOUS
  Administered 2017-12-07: 80 ug via INTRAVENOUS
  Administered 2017-12-07: 40 ug via INTRAVENOUS
  Administered 2017-12-07: 80 ug via INTRAVENOUS
  Administered 2017-12-07: 40 ug via INTRAVENOUS

## 2017-12-07 MED ORDER — HEMOSTATIC AGENTS (NO CHARGE) OPTIME
TOPICAL | Status: DC | PRN
  Administered 2017-12-07 (×2): 1 via TOPICAL
  Administered 2017-12-07: 2 via TOPICAL

## 2017-12-07 MED ORDER — MAGNESIUM SULFATE 4 GM/100ML IV SOLN
4.0000 g | Freq: Once | INTRAVENOUS | Status: AC
  Administered 2017-12-07: 4 g via INTRAVENOUS
  Filled 2017-12-07: qty 100

## 2017-12-07 MED ORDER — DOCUSATE SODIUM 100 MG PO CAPS
200.0000 mg | ORAL_CAPSULE | Freq: Every day | ORAL | Status: DC
  Filled 2017-12-07: qty 2

## 2017-12-07 MED ORDER — SODIUM CHLORIDE 0.9% FLUSH
3.0000 mL | Freq: Two times a day (BID) | INTRAVENOUS | Status: DC
  Administered 2017-12-08 – 2017-12-10 (×3): 3 mL via INTRAVENOUS
  Administered 2017-12-11: 23:00:00 via INTRAVENOUS
  Administered 2017-12-11: 3 mL via INTRAVENOUS

## 2017-12-07 MED ORDER — ACETAMINOPHEN 650 MG RE SUPP
650.0000 mg | Freq: Once | RECTAL | Status: AC
  Administered 2017-12-07: 650 mg via RECTAL

## 2017-12-07 MED ORDER — METOPROLOL TARTRATE 25 MG/10 ML ORAL SUSPENSION
12.5000 mg | Freq: Two times a day (BID) | ORAL | Status: DC
  Filled 2017-12-07: qty 5

## 2017-12-07 MED ORDER — SODIUM CHLORIDE 0.9 % IV SOLN
INTRAVENOUS | Status: DC | PRN
  Administered 2017-12-07: .7 [IU]/h via INTRAVENOUS

## 2017-12-07 MED ORDER — FENTANYL CITRATE (PF) 250 MCG/5ML IJ SOLN
INTRAMUSCULAR | Status: AC
  Filled 2017-12-07: qty 5

## 2017-12-07 MED ORDER — INSULIN ASPART 100 UNIT/ML ~~LOC~~ SOLN
0.0000 [IU] | SUBCUTANEOUS | Status: DC
  Administered 2017-12-07 – 2017-12-08 (×2): 2 [IU] via SUBCUTANEOUS

## 2017-12-07 MED ORDER — MILRINONE LACTATE IN DEXTROSE 20-5 MG/100ML-% IV SOLN
INTRAVENOUS | Status: DC | PRN
  Administered 2017-12-07: 0.125 ug/kg/min via INTRAVENOUS

## 2017-12-07 MED ORDER — PROTAMINE SULFATE 10 MG/ML IV SOLN
INTRAVENOUS | Status: DC | PRN
  Administered 2017-12-07: 40 mg via INTRAVENOUS
  Administered 2017-12-07: 10 mg via INTRAVENOUS
  Administered 2017-12-07 (×2): 40 mg via INTRAVENOUS
  Administered 2017-12-07: 20 mg via INTRAVENOUS
  Administered 2017-12-07: 40 mg via INTRAVENOUS
  Administered 2017-12-07: 10 mg via INTRAVENOUS

## 2017-12-07 MED ORDER — SODIUM CHLORIDE 0.9 % IV SOLN
20.0000 ug | INTRAVENOUS | Status: AC
  Administered 2017-12-07: 20 ug via INTRAVENOUS
  Filled 2017-12-07: qty 5

## 2017-12-07 MED ORDER — MIDAZOLAM HCL 2 MG/2ML IJ SOLN
INTRAMUSCULAR | Status: AC
  Filled 2017-12-07: qty 2

## 2017-12-07 MED ORDER — LACTATED RINGERS IV SOLN: 500.0000 mL | Freq: Once | INTRAVENOUS | Status: DC | PRN

## 2017-12-07 MED ORDER — BISACODYL 5 MG PO TBEC
10.0000 mg | DELAYED_RELEASE_TABLET | Freq: Every day | ORAL | Status: DC
  Administered 2017-12-10: 10 mg via ORAL
  Filled 2017-12-07 (×3): qty 2

## 2017-12-07 MED ORDER — ACETAMINOPHEN 160 MG/5ML PO SOLN
1000.0000 mg | Freq: Four times a day (QID) | ORAL | Status: DC
  Administered 2017-12-08 – 2017-12-12 (×11): 1000 mg
  Filled 2017-12-07 (×14): qty 40.6

## 2017-12-07 MED ORDER — 0.9 % SODIUM CHLORIDE (POUR BTL) OPTIME
TOPICAL | Status: DC | PRN
  Administered 2017-12-07: 5000 mL

## 2017-12-07 MED ORDER — FENTANYL CITRATE (PF) 250 MCG/5ML IJ SOLN
INTRAMUSCULAR | Status: DC | PRN
  Administered 2017-12-07: 250 ug via INTRAVENOUS
  Administered 2017-12-07: 100 ug via INTRAVENOUS
  Administered 2017-12-07: 200 ug via INTRAVENOUS
  Administered 2017-12-07: 100 ug via INTRAVENOUS
  Administered 2017-12-07: 50 ug via INTRAVENOUS
  Administered 2017-12-07: 100 ug via INTRAVENOUS
  Administered 2017-12-07: 250 ug via INTRAVENOUS
  Administered 2017-12-07: 100 ug via INTRAVENOUS
  Administered 2017-12-07: 50 ug via INTRAVENOUS

## 2017-12-07 MED ORDER — NITROGLYCERIN IN D5W 200-5 MCG/ML-% IV SOLN
0.0000 ug/min | INTRAVENOUS | Status: DC
  Administered 2017-12-07: 3 ug/min via INTRAVENOUS

## 2017-12-07 MED ORDER — BISACODYL 10 MG RE SUPP: 10.0000 mg | Freq: Every day | RECTAL | Status: DC

## 2017-12-07 MED ORDER — SODIUM CHLORIDE 0.9 % IV SOLN: INTRAVENOUS | Status: DC

## 2017-12-07 MED ORDER — MIDAZOLAM HCL 5 MG/5ML IJ SOLN
INTRAMUSCULAR | Status: DC | PRN
  Administered 2017-12-07 (×3): 2 mg via INTRAVENOUS

## 2017-12-07 MED ORDER — MILRINONE LACTATE IN DEXTROSE 20-5 MG/100ML-% IV SOLN: 0.1250 ug/kg/min | INTRAVENOUS | Status: DC

## 2017-12-07 MED ORDER — MIDAZOLAM HCL 2 MG/2ML IJ SOLN
2.0000 mg | INTRAMUSCULAR | Status: DC | PRN
  Administered 2017-12-07: 2 mg via INTRAVENOUS
  Filled 2017-12-07: qty 2

## 2017-12-07 MED ORDER — ALBUMIN HUMAN 5 % IV SOLN
250.0000 mL | INTRAVENOUS | Status: AC | PRN
  Administered 2017-12-07 (×3): 12.5 g via INTRAVENOUS
  Filled 2017-12-07: qty 250

## 2017-12-07 MED ORDER — METOPROLOL TARTRATE 5 MG/5ML IV SOLN: 2.5000 mg | INTRAVENOUS | Status: DC | PRN

## 2017-12-07 MED ORDER — PHENYLEPHRINE HCL-NACL 20-0.9 MG/250ML-% IV SOLN
0.0000 ug/min | INTRAVENOUS | Status: DC
  Filled 2017-12-07: qty 250

## 2017-12-07 MED ORDER — SUCCINYLCHOLINE CHLORIDE 200 MG/10ML IV SOSY
PREFILLED_SYRINGE | INTRAVENOUS | Status: AC
  Filled 2017-12-07: qty 10

## 2017-12-07 MED ORDER — SODIUM CHLORIDE 0.9 % IV SOLN
1.5000 g | Freq: Two times a day (BID) | INTRAVENOUS | Status: AC
  Administered 2017-12-07 – 2017-12-09 (×4): 1.5 g via INTRAVENOUS
  Filled 2017-12-07 (×4): qty 1.5

## 2017-12-07 MED ORDER — HEPARIN SODIUM (PORCINE) 1000 UNIT/ML IJ SOLN
INTRAMUSCULAR | Status: DC | PRN
  Administered 2017-12-07: 20000 [IU] via INTRAVENOUS

## 2017-12-07 MED ORDER — FENTANYL CITRATE (PF) 250 MCG/5ML IJ SOLN
INTRAMUSCULAR | Status: AC
  Filled 2017-12-07: qty 25

## 2017-12-07 MED ORDER — ACETAMINOPHEN 500 MG PO TABS
1000.0000 mg | ORAL_TABLET | Freq: Four times a day (QID) | ORAL | Status: DC
  Administered 2017-12-09: 1000 mg via ORAL
  Filled 2017-12-07 (×4): qty 2

## 2017-12-07 MED ORDER — ONDANSETRON HCL 4 MG/2ML IJ SOLN
INTRAMUSCULAR | Status: AC
  Filled 2017-12-07: qty 2

## 2017-12-07 MED ORDER — LACTATED RINGERS IV SOLN
INTRAVENOUS | Status: DC | PRN
  Administered 2017-12-07 (×3): via INTRAVENOUS

## 2017-12-07 MED ORDER — SODIUM CHLORIDE 0.9% FLUSH: 3.0000 mL | INTRAVENOUS | Status: DC | PRN

## 2017-12-07 MED ORDER — ASPIRIN 81 MG PO CHEW
324.0000 mg | CHEWABLE_TABLET | Freq: Every day | ORAL | Status: DC
  Administered 2017-12-11 – 2017-12-12 (×2): 324 mg
  Filled 2017-12-07 (×3): qty 4

## 2017-12-07 MED ORDER — KETOROLAC TROMETHAMINE 15 MG/ML IJ SOLN
15.0000 mg | Freq: Four times a day (QID) | INTRAMUSCULAR | Status: DC
  Administered 2017-12-07 – 2017-12-09 (×6): 15 mg via INTRAVENOUS
  Filled 2017-12-07 (×6): qty 1

## 2017-12-07 MED ORDER — SODIUM CHLORIDE 0.9 % IV SOLN
INTRAVENOUS | Status: DC | PRN
  Administered 2017-12-07: 40 ug/min via INTRAVENOUS

## 2017-12-07 MED ORDER — LIDOCAINE HCL (CARDIAC) PF 100 MG/5ML IV SOSY
PREFILLED_SYRINGE | INTRAVENOUS | Status: DC | PRN
  Administered 2017-12-07: 100 mg via INTRAVENOUS

## 2017-12-07 MED ORDER — ROCURONIUM BROMIDE 100 MG/10ML IV SOLN
INTRAVENOUS | Status: DC | PRN
  Administered 2017-12-07: 100 mg via INTRAVENOUS
  Administered 2017-12-07 (×2): 50 mg via INTRAVENOUS
  Administered 2017-12-07: 30 mg via INTRAVENOUS

## 2017-12-07 MED ORDER — FAMOTIDINE IN NACL 20-0.9 MG/50ML-% IV SOLN
20.0000 mg | Freq: Two times a day (BID) | INTRAVENOUS | Status: AC
  Administered 2017-12-07: 20 mg via INTRAVENOUS
  Filled 2017-12-07: qty 50

## 2017-12-07 MED ORDER — INSULIN REGULAR BOLUS VIA INFUSION
0.0000 [IU] | Freq: Three times a day (TID) | INTRAVENOUS | Status: DC
  Filled 2017-12-07: qty 10

## 2017-12-07 MED ORDER — DEXMEDETOMIDINE HCL IN NACL 200 MCG/50ML IV SOLN
0.0000 ug/kg/h | INTRAVENOUS | Status: DC
  Filled 2017-12-07: qty 50

## 2017-12-07 MED ORDER — POTASSIUM CHLORIDE 10 MEQ/50ML IV SOLN
10.0000 meq | INTRAVENOUS | Status: AC
  Administered 2017-12-07 (×3): 10 meq via INTRAVENOUS

## 2017-12-07 MED ORDER — TRAMADOL HCL 50 MG PO TABS
50.0000 mg | ORAL_TABLET | ORAL | Status: DC | PRN
  Administered 2017-12-09: 100 mg via ORAL
  Filled 2017-12-07: qty 2

## 2017-12-07 MED ORDER — PROPOFOL 10 MG/ML IV BOLUS
INTRAVENOUS | Status: AC
  Filled 2017-12-07: qty 20

## 2017-12-07 MED ORDER — PROPOFOL 10 MG/ML IV BOLUS
INTRAVENOUS | Status: DC | PRN
  Administered 2017-12-07: 130 mg via INTRAVENOUS

## 2017-12-07 MED ORDER — ROCURONIUM BROMIDE 50 MG/5ML IV SOSY
PREFILLED_SYRINGE | INTRAVENOUS | Status: AC
  Filled 2017-12-07: qty 15

## 2017-12-07 MED ORDER — PANTOPRAZOLE SODIUM 40 MG PO TBEC: 40.0000 mg | DELAYED_RELEASE_TABLET | Freq: Every day | ORAL | Status: DC

## 2017-12-07 MED ORDER — SODIUM CHLORIDE 0.9 % IV SOLN
INTRAVENOUS | Status: DC | PRN
  Administered 2017-12-07: .7 ug/kg/h via INTRAVENOUS

## 2017-12-07 SURGICAL SUPPLY — 97 items
ADAPTER CARDIO PERF ANTE/RETRO (ADAPTER) ×5 IMPLANT
BAG DECANTER FOR FLEXI CONT (MISCELLANEOUS) ×5 IMPLANT
BANDAGE ACE 4X5 VEL STRL LF (GAUZE/BANDAGES/DRESSINGS) ×5 IMPLANT
BANDAGE ACE 6X5 VEL STRL LF (GAUZE/BANDAGES/DRESSINGS) ×5 IMPLANT
BANDAGE ELASTIC 4 VELCRO ST LF (GAUZE/BANDAGES/DRESSINGS) ×5 IMPLANT
BANDAGE ELASTIC 6 VELCRO ST LF (GAUZE/BANDAGES/DRESSINGS) ×5 IMPLANT
BASKET HEART  (ORDER IN 25'S) (MISCELLANEOUS) ×1
BASKET HEART (ORDER IN 25'S) (MISCELLANEOUS) ×1
BASKET HEART (ORDER IN 25S) (MISCELLANEOUS) ×3 IMPLANT
BINDER BREAST LRG (GAUZE/BANDAGES/DRESSINGS) ×5 IMPLANT
BLADE CLIPPER SURG (BLADE) IMPLANT
BLADE MINI RND TIP GREEN BEAV (BLADE) ×5 IMPLANT
BLADE STERNUM SYSTEM 6 (BLADE) ×5 IMPLANT
BLADE SURG 12 STRL SS (BLADE) ×5 IMPLANT
BNDG GAUZE ELAST 4 BULKY (GAUZE/BANDAGES/DRESSINGS) ×5 IMPLANT
CANISTER SUCT 3000ML PPV (MISCELLANEOUS) ×5 IMPLANT
CANNULA GUNDRY RCSP 15FR (MISCELLANEOUS) ×5 IMPLANT
CATH CPB KIT VANTRIGT (MISCELLANEOUS) ×5 IMPLANT
CATH ROBINSON RED A/P 18FR (CATHETERS) ×10 IMPLANT
CATH THORACIC 36FR RT ANG (CATHETERS) ×5 IMPLANT
COVER WAND RF STERILE (DRAPES) ×10 IMPLANT
CRADLE DONUT ADULT HEAD (MISCELLANEOUS) ×5 IMPLANT
DERMABOND ADVANCED (GAUZE/BANDAGES/DRESSINGS) ×2
DERMABOND ADVANCED .7 DNX12 (GAUZE/BANDAGES/DRESSINGS) ×3 IMPLANT
DRAIN CHANNEL 32F RND 10.7 FF (WOUND CARE) ×5 IMPLANT
DRAPE CARDIOVASCULAR INCISE (DRAPES) ×2
DRAPE SLUSH/WARMER DISC (DRAPES) ×5 IMPLANT
DRAPE SRG 135X102X78XABS (DRAPES) ×3 IMPLANT
DRSG AQUACEL AG ADV 3.5X14 (GAUZE/BANDAGES/DRESSINGS) ×5 IMPLANT
ELECT BLADE 4.0 EZ CLEAN MEGAD (MISCELLANEOUS) ×5
ELECT BLADE 6.5 EXT (BLADE) ×5 IMPLANT
ELECT CAUTERY BLADE 6.4 (BLADE) ×5 IMPLANT
ELECT REM PT RETURN 9FT ADLT (ELECTROSURGICAL) ×10
ELECTRODE BLDE 4.0 EZ CLN MEGD (MISCELLANEOUS) ×3 IMPLANT
ELECTRODE REM PT RTRN 9FT ADLT (ELECTROSURGICAL) ×6 IMPLANT
FELT TEFLON 1X6 (MISCELLANEOUS) ×10 IMPLANT
GAUZE SPONGE 4X4 12PLY STRL (GAUZE/BANDAGES/DRESSINGS) ×5 IMPLANT
GAUZE SPONGE 4X4 12PLY STRL LF (GAUZE/BANDAGES/DRESSINGS) ×5 IMPLANT
GLOVE BIO SURGEON STRL SZ7.5 (GLOVE) ×15 IMPLANT
GOWN STRL REUS W/ TWL LRG LVL3 (GOWN DISPOSABLE) ×12 IMPLANT
GOWN STRL REUS W/TWL LRG LVL3 (GOWN DISPOSABLE) ×8
HEMOSTAT POWDER SURGIFOAM 1G (HEMOSTASIS) IMPLANT
HEMOSTAT SURGICEL 2X14 (HEMOSTASIS) ×5 IMPLANT
INSERT FOGARTY XLG (MISCELLANEOUS) IMPLANT
KIT BASIN OR (CUSTOM PROCEDURE TRAY) ×5 IMPLANT
KIT SUCTION CATH 14FR (SUCTIONS) ×5 IMPLANT
KIT TURNOVER KIT B (KITS) ×5 IMPLANT
KIT VASOVIEW HEMOPRO 2 VH 4000 (KITS) ×5 IMPLANT
LEAD PACING MYOCARDI (MISCELLANEOUS) ×5 IMPLANT
MARKER GRAFT CORONARY BYPASS (MISCELLANEOUS) ×15 IMPLANT
NS IRRIG 1000ML POUR BTL (IV SOLUTION) ×25 IMPLANT
PACK E OPEN HEART (SUTURE) ×5 IMPLANT
PACK OPEN HEART (CUSTOM PROCEDURE TRAY) ×5 IMPLANT
PAD ARMBOARD 7.5X6 YLW CONV (MISCELLANEOUS) ×10 IMPLANT
PAD ELECT DEFIB RADIOL ZOLL (MISCELLANEOUS) ×5 IMPLANT
PENCIL BUTTON HOLSTER BLD 10FT (ELECTRODE) ×5 IMPLANT
POWDER SURGICEL 3.0 GRAM (HEMOSTASIS) ×10 IMPLANT
PUNCH AORTIC ROTATE 4.0MM (MISCELLANEOUS) IMPLANT
PUNCH AORTIC ROTATE 4.5MM 8IN (MISCELLANEOUS) IMPLANT
PUNCH AORTIC ROTATE 5MM 8IN (MISCELLANEOUS) IMPLANT
SET CARDIOPLEGIA MPS 5001102 (MISCELLANEOUS) ×5 IMPLANT
SPONGE LAP 18X18 RF (DISPOSABLE) ×10 IMPLANT
SPONGE LAP 4X18 RFD (DISPOSABLE) ×5 IMPLANT
SURGIFLO W/THROMBIN 8M KIT (HEMOSTASIS) ×10 IMPLANT
SUT BONE WAX W31G (SUTURE) ×5 IMPLANT
SUT MNCRL AB 4-0 PS2 18 (SUTURE) IMPLANT
SUT PROLENE 3 0 SH DA (SUTURE) IMPLANT
SUT PROLENE 3 0 SH1 36 (SUTURE) IMPLANT
SUT PROLENE 4 0 RB 1 (SUTURE) ×2
SUT PROLENE 4 0 SH DA (SUTURE) ×5 IMPLANT
SUT PROLENE 4-0 RB1 .5 CRCL 36 (SUTURE) ×3 IMPLANT
SUT PROLENE 5 0 C 1 36 (SUTURE) IMPLANT
SUT PROLENE 6 0 C 1 30 (SUTURE) ×10 IMPLANT
SUT PROLENE 6 0 CC (SUTURE) ×15 IMPLANT
SUT PROLENE 7 0 BV 1 (SUTURE) ×15 IMPLANT
SUT PROLENE 8 0 BV175 6 (SUTURE) ×10 IMPLANT
SUT PROLENE BLUE 7 0 (SUTURE) ×5 IMPLANT
SUT SILK  1 MH (SUTURE)
SUT SILK 1 MH (SUTURE) IMPLANT
SUT SILK 2 0 SH CR/8 (SUTURE) ×5 IMPLANT
SUT SILK 3 0 SH CR/8 (SUTURE) IMPLANT
SUT STEEL 6MS V (SUTURE) ×10 IMPLANT
SUT STEEL SZ 6 DBL 3X14 BALL (SUTURE) ×5 IMPLANT
SUT VIC AB 1 CTX 36 (SUTURE) ×4
SUT VIC AB 1 CTX36XBRD ANBCTR (SUTURE) ×6 IMPLANT
SUT VIC AB 2-0 CT1 27 (SUTURE) ×2
SUT VIC AB 2-0 CT1 TAPERPNT 27 (SUTURE) ×3 IMPLANT
SUT VIC AB 2-0 CTX 27 (SUTURE) IMPLANT
SUT VIC AB 3-0 X1 27 (SUTURE) ×5 IMPLANT
SYSTEM SAHARA CHEST DRAIN ATS (WOUND CARE) ×5 IMPLANT
TAPE CLOTH SURG 4X10 WHT LF (GAUZE/BANDAGES/DRESSINGS) ×5 IMPLANT
TOWEL GREEN STERILE (TOWEL DISPOSABLE) ×5 IMPLANT
TOWEL GREEN STERILE FF (TOWEL DISPOSABLE) ×5 IMPLANT
TRAY FOLEY SLVR 16FR TEMP STAT (SET/KITS/TRAYS/PACK) ×5 IMPLANT
TUBING INSUFFLATION (TUBING) ×5 IMPLANT
UNDERPAD 30X30 (UNDERPADS AND DIAPERS) ×5 IMPLANT
WATER STERILE IRR 1000ML POUR (IV SOLUTION) ×10 IMPLANT

## 2017-12-07 NOTE — Progress Notes (Signed)
Pre Procedure note for inpatients:   [name] has been scheduled for Procedure(s): CORONARY ARTERY BYPASS GRAFTING (CABG) (N/A) TRANSESOPHAGEAL ECHOCARDIOGRAM (TEE) (N/A) today. The various methods of treatment have been discussed with the patient. After consideration of the risks, benefits and treatment options the patient has consented to the planned procedure.   The patient has been seen and labs reviewed. There are no changes in the patient's condition to prevent proceeding with the planned procedure today.  Recent labs:  Lab Results  Component Value Date   WBC 7.9 12/07/2017   HGB 12.0 12/07/2017   HCT 36.9 12/07/2017   PLT 290 12/07/2017   GLUCOSE 99 12/07/2017   CHOL 173 12/04/2017   TRIG 85 12/04/2017   HDL 47 12/04/2017   LDLCALC 109 (H) 12/04/2017   NA 139 12/07/2017   K 3.6 12/07/2017   CL 110 12/07/2017   CREATININE 0.68 12/07/2017   BUN 14 12/07/2017   CO2 24 12/07/2017   INR 1.02 12/06/2017   HGBA1C 5.6 12/06/2017    [name] III, MD 12/07/2017 7:20 AM

## 2017-12-07 NOTE — Anesthesia Procedure Notes (Signed)
Central Venous Catheter Insertion Performed by: Suzette Battiest, MD, anesthesiologist Start/End10/29/2019 6:30 AM, 12/07/2017 6:40 AM Patient location: Pre-op. Preanesthetic checklist: patient identified, IV checked, site marked, risks and benefits discussed, surgical consent, monitors and equipment checked, pre-op evaluation, timeout performed and anesthesia consent Hand hygiene performed  and maximum sterile barriers used  PA cath was placed.Swan type:thermodilution PA Cath depth:50 Procedure performed without using ultrasound guided technique. Attempts: 1 Patient tolerated the procedure well with no immediate complications.

## 2017-12-07 NOTE — Brief Op Note (Addendum)
12/03/2017 - 12/07/2017  8:11 AM  PATIENT:  [name]  57 y.o. female  PRE-OPERATIVE DIAGNOSIS:  CAD  POST-OPERATIVE DIAGNOSIS:  CAD  PROCEDURE:  Procedure(s): CORONARY ARTERY BYPASS GRAFTING (CABG) TIMES THREE USING LEFT INTERNAL MAMMARY ARTERY AND ENDOSCOPICALLY HARVESTED RIGHT SAPHENOUS VEIN. (N/A) TRANSESOPHAGEAL ECHOCARDIOGRAM (TEE) (N/A) ENDOVEIN HARVEST OF GREATER SAPHENOUS VEIN (Right) LIMA-LAD SVG-RCA SVG-OM  SURGEON:  Surgeon(s) and Role:    Ivin Poot, MD - Primary  PHYSICIAN ASSISTANT: WAYNE GOLD PA-C  ANESTHESIA:   general  EBL:  900 mL   BLOOD ADMINISTERED:1 CC PRBC and 1 FFP  DRAINS: PLEURAL AND PERICARDIAL CHEST DRAINS   LOCAL MEDICATIONS USED:  NONE  SPECIMEN:  No Specimen  DISPOSITION OF SPECIMEN:  N/A  COUNTS:  YES  TOURNIQUET:  * No tourniquets in log *  DICTATION: .Other Dictation: Dictation Number PENDING  PLAN OF CARE: Admit to inpatient   PATIENT DISPOSITION:  ICU - intubated and hemodynamically stable.   Delay start of Pharmacological VTE agent (>24hrs) due to surgical blood loss or risk of bleeding: yes  COMPLICATIONS: NO KNOWN  patient examined and medical record reviewed,agree with above note. Tharon Aquas Trigt III 12/08/2017

## 2017-12-07 NOTE — Anesthesia Procedure Notes (Signed)
Central Venous Catheter Insertion Performed by: Suzette Battiest, MD, anesthesiologist Start/End10/29/2019 6:30 AM, 12/07/2017 6:40 AM Patient location: Pre-op. Preanesthetic checklist: patient identified, IV checked, site marked, risks and benefits discussed, surgical consent, monitors and equipment checked, pre-op evaluation, timeout performed and anesthesia consent Position: Trendelenburg Lidocaine 1% used for infiltration and patient sedated Hand hygiene performed , maximum sterile barriers used  and Seldinger technique used Catheter size: 8.5 Fr Total catheter length 10. Central line and PA cath was placed.Sheath introducer Swan type:thermodilution PA Cath depth:50 Procedure performed using ultrasound guided technique. Ultrasound Notes:anatomy identified, needle tip was noted to be adjacent to the nerve/plexus identified, no ultrasound evidence of intravascular and/or intraneural injection and image(s) printed for medical record Attempts: 1 Following insertion, line sutured, dressing applied and Biopatch. Post procedure assessment: blood return through all ports, free fluid flow and no air  Patient tolerated the procedure well with no immediate complications.

## 2017-12-07 NOTE — Anesthesia Postprocedure Evaluation (Signed)
Anesthesia Post Note  Patient: [name]  Procedure(s) Performed: CORONARY ARTERY BYPASS GRAFTING (CABG) TIMES THREE USING LEFT INTERNAL MAMMARY ARTERY AND ENDOSCOPICALLY HARVESTED RIGHT SAPHENOUS VEIN. (N/A Chest) TRANSESOPHAGEAL ECHOCARDIOGRAM (TEE) (N/A Esophagus) ENDOVEIN HARVEST OF GREATER SAPHENOUS VEIN (Right Leg Lower)     Patient location during evaluation: SICU Anesthesia Type: General Level of consciousness: sedated Pain management: pain level controlled Vital Signs Assessment: post-procedure vital signs reviewed and stable Respiratory status: patient remains intubated per anesthesia plan Cardiovascular status: stable Postop Assessment: no apparent nausea or vomiting Anesthetic complications: no    Last Vitals:  Vitals:   12/07/17 0600 12/07/17 1353  BP: (!) 135/92 103/64  Pulse:  80  Resp: 18 15  Temp: 36.7 C   SpO2: 100% 100%    Last Pain:  Vitals:   12/07/17 0600  TempSrc: Oral  PainSc:                  Chloeanne Poteet DANIEL

## 2017-12-07 NOTE — Procedures (Signed)
Extubation Procedure Note  Patient Details:   Name: [name] DOB: 04-Dec-1960 MRN: [id number]   Airway Documentation:    Vent end date: 12/07/17 Vent end time: 1855   Evaluation  O2 sats: stable throughout Complications: No apparent complications Patient did tolerate procedure well. Bilateral Breath Sounds: Clear, Diminished   Yes   Positive cuff leak noted.  Pt placed on  4 L with humidity, no stridor noted.  Pt able to reach 625 mL using incentive spirometer.  [name] 12/07/2017, 7:10 PM

## 2017-12-07 NOTE — Anesthesia Procedure Notes (Signed)
Arterial Line Insertion Start/End10/29/2019 7:10 AM, 12/07/2017 7:20 AM Performed by: Carney Living, CRNA, CRNA  Patient location: Pre-op. Preanesthetic checklist: patient identified, monitors and equipment checked, pre-op evaluation and timeout performed Lidocaine 1% used for infiltration and patient sedated Left, radial was placed Catheter size: 20 G Hand hygiene performed , maximum sterile barriers used  and Seldinger technique used  Attempts: 1 Procedure performed without using ultrasound guided technique. Following insertion, dressing applied and Biopatch. Post procedure assessment: normal and unchanged  Patient tolerated the procedure well with no immediate complications. Additional procedure comments: A-line placed by [place], [place].

## 2017-12-07 NOTE — Transfer of Care (Signed)
Immediate Anesthesia Transfer of Care Note  Patient: [name]  Procedure(s) Performed: CORONARY ARTERY BYPASS GRAFTING (CABG) TIMES THREE USING LEFT INTERNAL MAMMARY ARTERY AND ENDOSCOPICALLY HARVESTED RIGHT SAPHENOUS VEIN. (N/A Chest) TRANSESOPHAGEAL ECHOCARDIOGRAM (TEE) (N/A Esophagus) ENDOVEIN HARVEST OF GREATER SAPHENOUS VEIN (Right Leg Lower)  Patient Location: SICU  Anesthesia Type:General  Level of Consciousness: Patient remains intubated per anesthesia plan  Airway & Oxygen Therapy: Patient remains intubated per anesthesia plan and Patient placed on Ventilator (see vital sign flow sheet for setting)  Post-op Assessment: Report given to RN and Post -op Vital signs reviewed and stable  Post vital signs: Reviewed and stable  Last Vitals:  Vitals Value Taken Time  BP 106/70 12/07/2017  1:58 PM  Temp 35.4 C 12/07/2017  2:05 PM  Pulse 80 12/07/2017  2:05 PM  Resp 12 12/07/2017  2:05 PM  SpO2 96 % 12/07/2017  2:05 PM  Vitals shown include unvalidated device data.  Last Pain:  Vitals:   12/07/17 0600  TempSrc: Oral  PainSc:          Complications: No apparent anesthesia complications

## 2017-12-07 NOTE — Anesthesia Procedure Notes (Addendum)
Procedure Name: Intubation Date/Time: 12/07/2017 7:28 AM Performed by: Carney Living, CRNA Pre-anesthesia Checklist: Patient identified, Emergency Drugs available, Suction available, Patient being monitored and Timeout performed Patient Re-evaluated:Patient Re-evaluated prior to induction Oxygen Delivery Method: Circle system utilized Preoxygenation: Pre-oxygenation with 100% oxygen Induction Type: IV induction Ventilation: Mask ventilation without difficulty Laryngoscope Size: Mac and 3 Grade View: Grade I Tube type: Oral Tube size: 7.5 mm Number of attempts: 1 Airway Equipment and Method: Stylet Placement Confirmation: ETT inserted through vocal cords under direct vision,  positive ETCO2 and breath sounds checked- equal and bilateral Secured at: 22 cm Tube secured with: Tape Dental Injury: Teeth and Oropharynx as per pre-operative assessment  Comments: Intubation performed by Cory Munch, SRNA

## 2017-12-07 NOTE — Progress Notes (Signed)
Patient ID: [name], female   DOB: 03-Mar-1960, 57 y.o.   MRN: [id number]   TCTS Evening Rounds:   Hemodynamically stable  CI = 2.2  Has started to wake up on vent.   Urine output good  CT output low  CBC    Component Value Date/Time   WBC 11.3 (H) 12/07/2017 1411   RBC 4.13 12/07/2017 1411   HGB 10.9 (L) 12/07/2017 1417   HCT 32.0 (L) 12/07/2017 1417   PLT 150 12/07/2017 1411   MCV 84.0 12/07/2017 1411   MCH 28.1 12/07/2017 1411   MCHC 33.4 12/07/2017 1411   RDW 13.2 12/07/2017 1411     BMET    Component Value Date/Time   NA 143 12/07/2017 1417   K 3.7 12/07/2017 1417   CL 106 12/07/2017 1232   CO2 24 12/07/2017 0205   GLUCOSE 111 (H) 12/07/2017 1417   BUN 9 12/07/2017 1232   CREATININE 0.40 (L) 12/07/2017 1232   CALCIUM 9.0 12/07/2017 0205   GFRNONAA >60 12/07/2017 0205   GFRAA >60 12/07/2017 0205     A/P:  Stable postop course. Continue current plans

## 2017-12-07 NOTE — Progress Notes (Addendum)
Paged cardiology provider on call to confirm that heparin gtt was not needed prior to CABG tomorrow morning.  No new orders given. Will continue to monitor.

## 2017-12-08 ENCOUNTER — Encounter (HOSPITAL_COMMUNITY): Payer: Self-pay | Admitting: Cardiothoracic Surgery

## 2017-12-08 ENCOUNTER — Inpatient Hospital Stay (HOSPITAL_COMMUNITY): Payer: Commercial Managed Care - PPO

## 2017-12-08 LAB — POCT I-STAT 3, ART BLOOD GAS (G3+)
Acid-base deficit: 3 mmol/L — ABNORMAL HIGH (ref 0.0–2.0)
Acid-base deficit: 4 mmol/L — ABNORMAL HIGH (ref 0.0–2.0)
Bicarbonate: 22.5 mmol/L (ref 20.0–28.0)
Bicarbonate: 22.9 mmol/L (ref 20.0–28.0)
O2 Saturation: 93 %
O2 Saturation: 95 %
PH ART: 7.287 — AB (ref 7.350–7.450)
PH ART: 7.339 — AB (ref 7.350–7.450)
Patient temperature: 36.7
TCO2: 24 mmol/L (ref 22–32)
TCO2: 24 mmol/L (ref 22–32)
pCO2 arterial: 42.3 mmHg (ref 32.0–48.0)
pCO2 arterial: 46.8 mmHg (ref 32.0–48.0)
pO2, Arterial: 68 mmHg — ABNORMAL LOW (ref 83.0–108.0)
pO2, Arterial: 79 mmHg — ABNORMAL LOW (ref 83.0–108.0)

## 2017-12-08 LAB — CBC
HCT: 34.7 % — ABNORMAL LOW (ref 36.0–46.0)
HCT: 31.9 % — ABNORMAL LOW (ref 36.0–46.0)
HEMATOCRIT: 32.9 % — AB (ref 36.0–46.0)
HEMOGLOBIN: 10.5 g/dL — AB (ref 12.0–15.0)
Hemoglobin: 11.6 g/dL — ABNORMAL LOW (ref 12.0–15.0)
Hemoglobin: 10.1 g/dL — ABNORMAL LOW (ref 12.0–15.0)
MCH: 28.1 pg (ref 26.0–34.0)
MCH: 27.2 pg (ref 26.0–34.0)
MCH: 27.8 pg (ref 26.0–34.0)
MCHC: 33.4 g/dL (ref 30.0–36.0)
MCHC: 31.7 g/dL (ref 30.0–36.0)
MCHC: 31.9 g/dL (ref 30.0–36.0)
MCV: 84 fL (ref 80.0–100.0)
MCV: 85.8 fL (ref 80.0–100.0)
MCV: 87 fL (ref 80.0–100.0)
NRBC: 0 % (ref 0.0–0.2)
PLATELETS: 153 10*3/uL (ref 150–400)
Platelets: 150 10*3/uL (ref 150–400)
Platelets: 180 10*3/uL (ref 150–400)
RBC: 4.13 MIL/uL (ref 3.87–5.11)
RBC: 3.72 MIL/uL — AB (ref 3.87–5.11)
RBC: 3.78 MIL/uL — AB (ref 3.87–5.11)
RDW: 13.2 % (ref 11.5–15.5)
RDW: 14 % (ref 11.5–15.5)
RDW: 14.3 % (ref 11.5–15.5)
WBC: 11.3 10*3/uL — AB (ref 4.0–10.5)
WBC: 8.8 10*3/uL (ref 4.0–10.5)
WBC: 10 10*3/uL (ref 4.0–10.5)
nRBC: 0 % (ref 0.0–0.2)
nRBC: 0 % (ref 0.0–0.2)

## 2017-12-08 LAB — BPAM FFP
Blood Product Expiration Date: 201910312359
ISSUE DATE / TIME: 201910291116
Unit Type and Rh: 8400

## 2017-12-08 LAB — BASIC METABOLIC PANEL
Anion gap: 7 (ref 5–15)
BUN: 7 mg/dL (ref 6–20)
CALCIUM: 8.3 mg/dL — AB (ref 8.9–10.3)
CO2: 22 mmol/L (ref 22–32)
CREATININE: 0.55 mg/dL (ref 0.44–1.00)
Chloride: 110 mmol/L (ref 98–111)
Glucose, Bld: 108 mg/dL — ABNORMAL HIGH (ref 70–99)
Potassium: 4.3 mmol/L (ref 3.5–5.1)
SODIUM: 139 mmol/L (ref 135–145)

## 2017-12-08 LAB — PREPARE FRESH FROZEN PLASMA

## 2017-12-08 LAB — GLUCOSE, CAPILLARY
GLUCOSE-CAPILLARY: 135 mg/dL — AB (ref 70–99)
GLUCOSE-CAPILLARY: 90 mg/dL (ref 70–99)
GLUCOSE-CAPILLARY: 98 mg/dL (ref 70–99)
Glucose-Capillary: 106 mg/dL — ABNORMAL HIGH (ref 70–99)
Glucose-Capillary: 138 mg/dL — ABNORMAL HIGH (ref 70–99)

## 2017-12-08 LAB — POCT I-STAT, CHEM 8
BUN: 11 mg/dL (ref 6–20)
CALCIUM ION: 1.18 mmol/L (ref 1.15–1.40)
Chloride: 102 mmol/L (ref 98–111)
Creatinine, Ser: 0.6 mg/dL (ref 0.44–1.00)
GLUCOSE: 167 mg/dL — AB (ref 70–99)
HCT: 30 % — ABNORMAL LOW (ref 36.0–46.0)
Hemoglobin: 10.2 g/dL — ABNORMAL LOW (ref 12.0–15.0)
Potassium: 4.3 mmol/L (ref 3.5–5.1)
Sodium: 137 mmol/L (ref 135–145)
TCO2: 24 mmol/L (ref 22–32)

## 2017-12-08 LAB — MAGNESIUM
MAGNESIUM: 2.5 mg/dL — AB (ref 1.7–2.4)
Magnesium: 2.7 mg/dL — ABNORMAL HIGH (ref 1.7–2.4)

## 2017-12-08 LAB — CREATININE, SERUM: CREATININE: 0.73 mg/dL (ref 0.44–1.00)

## 2017-12-08 MED ORDER — CYCLOBENZAPRINE HCL 10 MG PO TABS
5.0000 mg | ORAL_TABLET | Freq: Every day | ORAL | Status: DC
  Administered 2017-12-08 – 2017-12-11 (×4): 5 mg via ORAL
  Filled 2017-12-08 (×4): qty 1

## 2017-12-08 MED ORDER — FUROSEMIDE 10 MG/ML IJ SOLN
20.0000 mg | Freq: Two times a day (BID) | INTRAMUSCULAR | Status: DC
  Administered 2017-12-08 – 2017-12-11 (×6): 20 mg via INTRAVENOUS
  Filled 2017-12-08 (×6): qty 2

## 2017-12-08 MED FILL — Electrolyte-R (PH 7.4) Solution: INTRAVENOUS | Qty: 4000 | Status: AC

## 2017-12-08 MED FILL — Sodium Chloride IV Soln 0.9%: INTRAVENOUS | Qty: 2000 | Status: AC

## 2017-12-08 MED FILL — Potassium Chloride Inj 2 mEq/ML: INTRAVENOUS | Qty: 40 | Status: AC

## 2017-12-08 MED FILL — Lidocaine HCl(Cardiac) IV PF Soln Pref Syr 100 MG/5ML (2%): INTRAVENOUS | Qty: 5 | Status: AC

## 2017-12-08 MED FILL — Sodium Bicarbonate IV Soln 8.4%: INTRAVENOUS | Qty: 50 | Status: AC

## 2017-12-08 MED FILL — Heparin Sodium (Porcine) Inj 1000 Unit/ML: INTRAMUSCULAR | Qty: 10 | Status: AC

## 2017-12-08 MED FILL — Mannitol IV Soln 20%: INTRAVENOUS | Qty: 500 | Status: AC

## 2017-12-08 MED FILL — Heparin Sodium (Porcine) Inj 1000 Unit/ML: INTRAMUSCULAR | Qty: 30 | Status: AC

## 2017-12-08 MED FILL — Magnesium Sulfate Inj 50%: INTRAMUSCULAR | Qty: 10 | Status: AC

## 2017-12-08 NOTE — Progress Notes (Signed)
1 Day Post-Op Procedure(s) (LRB): CORONARY ARTERY BYPASS GRAFTING (CABG) TIMES THREE USING LEFT INTERNAL MAMMARY ARTERY AND ENDOSCOPICALLY HARVESTED RIGHT SAPHENOUS VEIN. (N/A) TRANSESOPHAGEAL ECHOCARDIOGRAM (TEE) (N/A) ENDOVEIN HARVEST OF GREATER SAPHENOUS VEIN (Right) Subjective: Chest soreness nsr Small air leak in pleurovac  Objective: Vital signs in last 24 hours: Temp:  [95.5 F (35.3 C)-98.2 F (36.8 C)] 98.1 F (36.7 C) (10/30 0600) Pulse Rate:  [77-92] 84 (10/30 0600) Cardiac Rhythm: Atrial paced (10/29 2330) Resp:  [9-24] 15 (10/30 0600) BP: (85-106)/(53-70) 99/69 (10/29 1823) SpO2:  [90 %-100 %] 93 % (10/30 0600) Arterial Line BP: (82-154)/(54-99) 118/61 (10/30 0600) FiO2 (%):  [40 %-50 %] 40 % (10/29 1823) Weight:  [68 kg] 68 kg (10/30 0400)  Hemodynamic parameters for last 24 hours: PAP: (19-35)/(8-21) 28/12 CO:  [3.4 L/min-5.7 L/min] 5.7 L/min CI:  [2.1 L/min/m2-3.5 L/min/m2] 3.5 L/min/m2  Intake/Output from previous day: 10/29 0701 - 10/30 0700 In: 6632.4 [I.V.:4047.8; Blood:769; IV Piggyback:1815.6] Out: 4193 [Urine:3940; Blood:900; Chest Tube:495] Intake/Output this shift: Total I/O In: 122.3 [I.V.:22.3; IV Piggyback:100] Out: 70 [Urine:50; Chest Tube:20]       Exam    General- alert and comfortable    Neck- no JVD, no cervical adenopathy palpable, no carotid bruit   Lungs- clear without rales, wheezes   Cor- regular rate and rhythm, no murmur , gallop   Abdomen- soft, non-tender   Extremities - warm, non-tender, minimal edema   Neuro- oriented, appropriate, no focal weakness   Lab Results: Recent Labs    12/07/17 2028 12/07/17 2038 12/08/17 0507  WBC 12.9*  --  8.8  HGB 10.7* 9.9* 10.1*  HCT 33.5* 29.0* 31.9*  PLT 165  --  153   BMET:  Recent Labs    12/07/17 0205  12/07/17 2038 12/08/17 0507  NA 139   < > 141 139  K 3.6   < > 4.7 4.3  CL 110   < > 108 110  CO2 24  --   --  22  GLUCOSE 99   < > 132* 108*  BUN 14   < > 9 7   CREATININE 0.68   < > 0.50 0.55  CALCIUM 9.0  --   --  8.3*   < > = values in this interval not displayed.    PT/INR:  Recent Labs    12/07/17 1411  LABPROT 16.8*  INR 1.38   ABG    Component Value Date/Time   PHART 7.339 (L) 12/08/2017 0746   HCO3 22.9 12/08/2017 0746   TCO2 24 12/08/2017 0746   ACIDBASEDEF 3.0 (H) 12/08/2017 0746   O2SAT 93.0 12/08/2017 0746   CBG (last 3)  Recent Labs    12/08/17 0014 12/08/17 0314 12/08/17 0746  GLUCAP 135* 98 106*    Assessment/Plan: S/P Procedure(s) (LRB): CORONARY ARTERY BYPASS GRAFTING (CABG) TIMES THREE USING LEFT INTERNAL MAMMARY ARTERY AND ENDOSCOPICALLY HARVESTED RIGHT SAPHENOUS VEIN. (N/A) TRANSESOPHAGEAL ECHOCARDIOGRAM (TEE) (N/A) ENDOVEIN HARVEST OF GREATER SAPHENOUS VEIN (Right) Mobilize Diuresis Diabetes control See progression orders   LOS: 4 days    [name] 12/08/2017

## 2017-12-08 NOTE — Progress Notes (Signed)
Patient ID: [name], female   DOB: 13-Aug-1960, 57 y.o.   MRN: [id number] EVENING ROUNDS NOTE :     [address]       [place] [address]             [phone]                 1 Day Post-Op Procedure(s) (LRB): CORONARY ARTERY BYPASS GRAFTING (CABG) TIMES THREE USING LEFT INTERNAL MAMMARY ARTERY AND ENDOSCOPICALLY HARVESTED RIGHT SAPHENOUS VEIN. (N/A) TRANSESOPHAGEAL ECHOCARDIOGRAM (TEE) (N/A) ENDOVEIN HARVEST OF GREATER SAPHENOUS VEIN (Right)  Total Length of Stay:  LOS: 4 days  BP 93/66   Pulse 75   Temp 98.6 F (37 C) (Oral)   Resp 20   Ht 5\' 2"  (1.575 m)   Wt 68 kg   SpO2 100%   BMI 27.42 kg/m   .Intake/Output      10/30 0701 - 10/31 0700   P.O. 480   I.V. (mL/kg) 59.3 (0.9)   Blood    IV Piggyback 200   Total Intake(mL/kg) 739.3 (10.9)   Urine (mL/kg/hr) 800 (1)   Blood    Chest Tube 220   Total Output 1020   Net -280.7         . sodium chloride    . cefUROXime (ZINACEF)  IV Stopped (12/08/17 1533)  . lactated ringers    . nitroGLYCERIN Stopped (12/07/17 1731)  . phenylephrine (NEO-SYNEPHRINE) Adult infusion 10 mcg/min (12/07/17 2200)     Lab Results  Component Value Date   WBC 10.0 12/08/2017   HGB 10.2 (L) 12/08/2017   HCT 30.0 (L) 12/08/2017   PLT 180 12/08/2017   GLUCOSE 167 (H) 12/08/2017   CHOL 173 12/04/2017   TRIG 85 12/04/2017   HDL 47 12/04/2017   LDLCALC 109 (H) 12/04/2017   NA 137 12/08/2017   K 4.3 12/08/2017   CL 102 12/08/2017   CREATININE 0.60 12/08/2017   BUN 11 12/08/2017   CO2 22 12/08/2017   INR 1.38 12/07/2017   HGBA1C 5.6 12/06/2017   Stable day    Grace Isaac MD  Beeper [phone] Office [phone] 12/08/2017 7:14 PM

## 2017-12-09 ENCOUNTER — Inpatient Hospital Stay (HOSPITAL_COMMUNITY): Payer: Commercial Managed Care - PPO

## 2017-12-09 LAB — BASIC METABOLIC PANEL
Anion gap: 4 — ABNORMAL LOW (ref 5–15)
BUN: 14 mg/dL (ref 6–20)
CO2: 27 mmol/L (ref 22–32)
Calcium: 8.1 mg/dL — ABNORMAL LOW (ref 8.9–10.3)
Chloride: 104 mmol/L (ref 98–111)
Creatinine, Ser: 0.66 mg/dL (ref 0.44–1.00)
GFR calc Af Amer: >60 mL/min (ref >60)
GFR calc non Af Amer: >60 mL/min (ref >60)
Glucose, Bld: 119 mg/dL — ABNORMAL HIGH (ref 70–99)
Potassium: 4 mmol/L (ref 3.5–5.1)
Sodium: 135 mmol/L (ref 135–145)

## 2017-12-09 LAB — CBC
HCT: 31.3 % — ABNORMAL LOW (ref 36.0–46.0)
Hemoglobin: 10.1 g/dL — ABNORMAL LOW (ref 12.0–15.0)
MCH: 28.1 pg (ref 26.0–34.0)
MCHC: 32.3 g/dL (ref 30.0–36.0)
MCV: 86.9 fL (ref 80.0–100.0)
Platelets: 171 10*3/uL (ref 150–400)
RBC: 3.6 MIL/uL — ABNORMAL LOW (ref 3.87–5.11)
RDW: 14.4 % (ref 11.5–15.5)
WBC: 9.3 10*3/uL (ref 4.0–10.5)
nRBC: 0 % (ref 0.0–0.2)

## 2017-12-09 MED ORDER — PANTOPRAZOLE SODIUM 40 MG PO PACK
40.0000 mg | PACK | Freq: Every day | ORAL | Status: DC
  Administered 2017-12-09 – 2017-12-11 (×3): 40 mg
  Filled 2017-12-09 (×4): qty 20

## 2017-12-09 MED ORDER — METOCLOPRAMIDE HCL 5 MG/ML IJ SOLN
10.0000 mg | Freq: Four times a day (QID) | INTRAMUSCULAR | Status: DC
  Administered 2017-12-09 – 2017-12-10 (×5): 10 mg via INTRAVENOUS
  Filled 2017-12-09 (×8): qty 2

## 2017-12-09 MED ORDER — ONDANSETRON HCL 4 MG PO TABS: 4.0000 mg | ORAL_TABLET | Freq: Four times a day (QID) | ORAL | Status: DC | PRN

## 2017-12-09 MED ORDER — MORPHINE SULFATE (PF) 2 MG/ML IV SOLN: 2.0000 mg | INTRAVENOUS | Status: DC | PRN

## 2017-12-09 MED ORDER — SODIUM CHLORIDE 0.9 % IV SOLN
250.0000 mL | INTRAVENOUS | Status: DC | PRN
  Administered 2017-12-09: 250 mL via INTRAVENOUS

## 2017-12-09 MED ORDER — DOCUSATE SODIUM 50 MG/5ML PO LIQD
200.0000 mg | Freq: Every day | ORAL | Status: DC
  Administered 2017-12-10: 200 mg via ORAL
  Filled 2017-12-09 (×3): qty 20

## 2017-12-09 MED ORDER — ALBUMIN HUMAN 25 % IV SOLN
12.5000 g | Freq: Once | INTRAVENOUS | Status: AC
  Administered 2017-12-09: 12.5 g via INTRAVENOUS
  Filled 2017-12-09: qty 50

## 2017-12-09 MED ORDER — ZOLPIDEM TARTRATE 5 MG PO TABS: 5.0000 mg | ORAL_TABLET | Freq: Every evening | ORAL | Status: DC | PRN

## 2017-12-09 MED ORDER — DOPAMINE-DEXTROSE 3.2-5 MG/ML-% IV SOLN
2.0000 ug/kg/min | INTRAVENOUS | Status: DC
  Administered 2017-12-09: 3 ug/kg/min via INTRAVENOUS
  Filled 2017-12-09: qty 250

## 2017-12-09 MED ORDER — ASPIRIN 300 MG RE SUPP
300.0000 mg | Freq: Once | RECTAL | Status: AC
  Administered 2017-12-09: 300 mg via RECTAL
  Filled 2017-12-09: qty 1

## 2017-12-09 MED ORDER — SODIUM CHLORIDE 0.9% FLUSH: 3.0000 mL | INTRAVENOUS | Status: DC | PRN

## 2017-12-09 MED ORDER — ONDANSETRON HCL 4 MG/2ML IJ SOLN: 4.0000 mg | Freq: Four times a day (QID) | INTRAMUSCULAR | Status: DC | PRN

## 2017-12-09 MED ORDER — OXYCODONE HCL 5 MG/5ML PO SOLN: 5.0000 mg | ORAL | Status: DC | PRN

## 2017-12-09 MED ORDER — MOVING RIGHT ALONG BOOK
Freq: Once | Status: AC
  Administered 2017-12-09: 09:00:00
  Filled 2017-12-09: qty 1

## 2017-12-09 MED ORDER — SODIUM CHLORIDE 0.9% FLUSH
3.0000 mL | Freq: Two times a day (BID) | INTRAVENOUS | Status: DC
  Administered 2017-12-09 – 2017-12-12 (×4): 3 mL via INTRAVENOUS

## 2017-12-09 MED ORDER — POTASSIUM CHLORIDE CRYS ER 20 MEQ PO TBCR
20.0000 meq | EXTENDED_RELEASE_TABLET | Freq: Every day | ORAL | Status: DC
  Administered 2017-12-09 – 2017-12-11 (×3): 20 meq via ORAL
  Filled 2017-12-09 (×3): qty 1

## 2017-12-09 NOTE — Discharge Instructions (Signed)
Endoscopic Saphenous Vein Harvesting, Care After °Refer to this sheet in the next few weeks. These instructions provide you with information about caring for yourself after your procedure. Your health care provider may also give you more specific instructions. Your treatment has been planned according to current medical practices, but problems sometimes occur. Call your health care provider if you have any problems or questions after your procedure. °What can I expect after the procedure? °After the procedure, it is common to have: °· Pain. °· Bruising. °· Swelling. °· Numbness. ° °Follow these instructions at home: °Medicine °· Take over-the-counter and prescription medicines only as told by your health care provider. °· Do not drive or operate heavy machinery while taking prescription pain medicine. °Incision care ° °· Follow instructions from your health care provider about how to take care of the cut made during surgery (incision). Make sure you: °? Wash your hands with soap and water before you change your bandage (dressing). If soap and water are not available, use hand sanitizer. °? Change your dressing as told by your health care provider. °? Leave stitches (sutures), skin glue, or adhesive strips in place. These skin closures may need to be in place for 2 weeks or longer. If adhesive strip edges start to loosen and curl up, you may trim the loose edges. Do not remove adhesive strips completely unless your health care provider tells you to do that. °· Check your incision area every day for signs of infection. Check for: °? More redness, swelling, or pain. °? More fluid or blood. °? Warmth. °? Pus or a bad smell. °General instructions °· Raise (elevate) your legs above the level of your heart while you are sitting or lying down. °· Do any exercises your health care providers have given you. These may include deep breathing, coughing, and walking exercises. °· Do not shower, take baths, swim, or use a hot tub  unless told by your health care provider. °· Wear your elastic stocking if told by your health care provider. °· Keep all follow-up visits as told by your health care provider. This is important. °Contact a health care provider if: °· Medicine does not help your pain. °· Your pain gets worse. °· You have new leg bruises or your leg bruises get bigger. °· You have a fever. °· Your leg feels numb. °· You have more redness, swelling, or pain around your incision. °· You have more fluid or blood coming from your incision. °· Your incision feels warm to the touch. °· You have pus or a bad smell coming from your incision. °Get help right away if: °· Your pain is severe. °· You develop pain, tenderness, warmth, redness, or swelling in any part of your leg. °· You have chest pain. °· You have trouble breathing. °This information is not intended to replace advice given to you by your health care provider. Make sure you discuss any questions you have with your health care provider. °Document Released: 10/08/2010 Document Revised: 07/04/2015 Document Reviewed: 12/10/2014 °Elsevier Interactive Patient Education © 2018 [employer] °Coronary Artery Bypass Grafting, Care After °These instructions give you information on caring for yourself after your procedure. Your doctor may also give you more specific instructions. Call your doctor if you have any problems or questions after your procedure. °Follow these instructions at home: °· Only take medicine as told by your doctor. Take medicines exactly as told. Do not stop taking medicines or start any new medicines without talking to your doctor first. °·   Take your pulse as told by your doctor. °· Do deep breathing as told by your doctor. Use your breathing device (incentive spirometer), if given, to practice deep breathing several times a day. Support your chest with a pillow or your arms when you take deep breaths or cough. °· Keep the area clean, dry, and protected where the  surgery cuts (incisions) were made. Remove bandages (dressings) only as told by your doctor. If strips were applied to surgical area, do not take them off. They fall off on their own. °· Check the surgery area daily for puffiness (swelling), redness, or leaking fluid. °· If surgery cuts were made in your legs: °? Avoid crossing your legs. °? Avoid sitting for long periods of time. Change positions every 30 minutes. °? Raise your legs when you are sitting. Place them on pillows. °· Wear stockings that help keep blood clots from forming in your legs (compression stockings). °· Only take sponge baths until your doctor says it is okay to take showers. Pat the surgery area dry. Do not rub the surgery area with a washcloth or towel. Do not bathe, swim, or use a hot tub until your doctor says it is okay. °· Eat foods that are high in fiber. These include raw fruits and vegetables, whole grains, beans, and nuts. Choose lean meats. Avoid canned, processed, and fried foods. °· Drink enough fluids to keep your pee (urine) clear or pale yellow. °· Weigh yourself every day. °· Rest and limit activity as told by your doctor. You may be told to: °? Stop any activity if you have chest pain, shortness of breath, changes in heartbeat, or dizziness. Get help right away if this happens. °? Move around often for short amounts of time or take short walks as told by your doctor. Gradually become more active. You may need help to strengthen your muscles and build endurance. °? Avoid lifting, pushing, or pulling anything heavier than 10 pounds (4.5 kg) for at least 6 weeks after surgery. °· Do not drive until your doctor says it is okay. °· Ask your doctor when you can go back to work. °· Ask your doctor when you can begin sexual activity again. °· Follow up with your doctor as told. °Contact a doctor if: °· You have puffiness, redness, more pain, or fluid draining from the incision site. °· You have a fever. °· You have puffiness in your  ankles or legs. °· You have pain in your legs. °· You gain 2 or more pounds (0.9 kg) a day. °· You feel sick to your stomach (nauseous) or throw up (vomit). °· You have watery poop (diarrhea). °Get help right away if: °· You have chest pain that goes to your jaw or arms. °· You have shortness of breath. °· You have a fast or irregular heartbeat. °· You notice a "clicking" in your breastbone when you move. °· You have numbness or weakness in your arms or legs. °· You feel dizzy or light-headed. °This information is not intended to replace advice given to you by your health care provider. Make sure you discuss any questions you have with your health care provider. °Document Released: 01/31/2013 Document Revised: 07/04/2015 Document Reviewed: 07/05/2012 °Elsevier Interactive Patient Education © 2017 [employer] ° °

## 2017-12-09 NOTE — Progress Notes (Signed)
Clear non muffled heart sounds. No JVD. Reassessing pressure.

## 2017-12-09 NOTE — Discharge Summary (Signed)
Physician Discharge Summary  Patient ID: [name] MRN: [id number] DOB/AGE: 17-Jul-1960 57 y.o.  Admit date: 12/03/2017 Discharge date: 12/12/2017  Admission Diagnoses:  1. Unstable angina 2. Coronary artery disease  Discharge Diagnoses:  1. S/P CABG x 3 Patient Active Problem List   Diagnosis Date Noted  . S/P CABG x 3 12/07/2017  . Coronary artery disease involving native coronary artery of native heart with unstable angina pectoris ([place])   . Chest pain 12/04/2017  . Coronary artery disease involving native coronary artery of native heart with angina pectoris ([place])   . Essential hypertension   . Chest pain at rest   . PMB (postmenopausal bleeding) 09/20/2013  . Menopause 09/07/2012  . Libido, decreased 09/07/2012   History of present illness:  Patient is a 57 year old female who was diagnosed with severe three-vessel coronary artery disease.  She was admitted to the hospital on 12/03/2017 after an episode of emotional stress and presyncope.  She did have associated chest pain.  Initial troponin in the emergency department was negative.  She was admitted for further management to include cardiac CT and catheterization.  The catheterization revealed severe three-vessel coronary artery disease with an 80% stenosis in the proximal LAD, 80% stenosis in the distal circumflex and 80% stenosis of the dominant right coronary artery.  EF is normal by echocardiogram and ventriculogram.  LVEDP is normal.  She had no valvular disease on echocardiogram.  She was felt to be a candidate for cardiothoracic surgical intervention and was seen by Dr. Darcey Nora who recommended CABG.  Discharged Condition: Stable and discharged to home.  Hospital Course: Patient was admitted to the emergency department for further evaluation and treatment.  After thorough evaluation and cardiothoracic surgical consultation she was felt to be adequate candidate to proceed with surgery.  On 12/07/2017 she was taken  to the operating room where she underwent the below described procedure.  She tolerated well was taken to the surgical intensive care unit in stable condition.  Post operative hospital course: As dictated by Jadene Pierini PA-C: The patient has overall progressed nicely.  She has maintained stable hemodynamics and normal sinus rhythm.  She was weaned from the ventilator using standard protocols without difficulty.  All routine lines, monitors and drainage devices have been discontinued in standard fashion.  She does have a mild expected acute blood loss anemia and values have stabilized.  Renal function has remained normal.  She has had some low relative blood pressures and ACE inhibitor or ARB have not been started as of this point.  She is tolerating gradually increasing activities using standard cardiac rehab protocols.  Incisions are noted to be healing well without evidence of infection.  She did have some postoperative volume overload but is responding well to diuretics.  Wounds are clean, dry, and continuing to heal without signs of infection. She been weaned and maintains good saturations on room air.  Update:  Lopressor was titrated to 25 mg bid and she was restarted on Lisinopril 2.5 mg daily for better BP control on 11/03. She will require a few more days of diuresis as well.  Epicardial pacing wires were removed on 12/11/2017. Chest tube sutures were removed on 12/12/2017. Per Dr. Prescott Gum, Plavix will be started the day of discharge (ACS) and aspirin will be decreased to 81 mg daily. As discussed with Dr. Cyndia Bent, she is felt surgically stable for discharge today.  Consults: cardiology  Significant Diagnostic Studies: angiography: Cardiac catheterization and echocardiogram  Treatments: surgery:  12/03/2017 - 12/07/2017  8:11 AM  PATIENT:  [name]  57 y.o. female  PRE-OPERATIVE DIAGNOSIS:  CAD  POST-OPERATIVE DIAGNOSIS:  CAD  PROCEDURE:  Procedure(s): CORONARY  ARTERY BYPASS GRAFTING (CABG) TIMES THREE USING LEFT INTERNAL MAMMARY ARTERY AND ENDOSCOPICALLY HARVESTED RIGHT SAPHENOUS VEIN. (N/A) TRANSESOPHAGEAL ECHOCARDIOGRAM (TEE) (N/A) ENDOVEIN HARVEST OF GREATER SAPHENOUS VEIN (Right) LIMA-LAD SVG-RCA SVG-OM  SURGEON:  Surgeon(s) and Role:    Ivin Poot, MD - Primary   Discharge Exam: Blood pressure (!) 149/96, pulse 99, temperature 99.1 F (37.3 C), temperature source Oral, resp. rate 20, height 5\' 2"  (1.575 m), weight 63.3 kg, SpO2 93 %. Cardiovascular: RRR Pulmonary: Slightly diminished at bases Abdomen: Soft, non tender, bowel sounds present. Extremities: Trace bilateral lower extremity edema. Wounds: Clean and dry.  No erythema or signs of infection.  Disposition: Discharge disposition: 01-Home or Self Care     Stable and discharged home.  Discharge Instructions    Amb Referral to Cardiac Rehabilitation   Complete by:  As directed    Diagnosis:  CABG   CABG X ___:  3     Allergies as of 12/12/2017      Reactions   Sulfa Antibiotics Swelling      Medication List    TAKE these medications   aspirin EC 81 MG tablet Take 1 tablet (81 mg total) by mouth daily. Start taking on:  12/13/2017   atorvastatin 40 MG tablet Commonly known as:  LIPITOR Take 1 tablet (40 mg total) by mouth See admin instructions. Monday, Wednesday, Friday and Sunday What changed:    medication strength  how much to take   clopidogrel 75 MG tablet Commonly known as:  PLAVIX Take 1 tablet (75 mg total) by mouth daily.   DULoxetine 60 MG capsule Commonly known as:  CYMBALTA Take 30 mg by mouth daily.   famotidine 40 MG tablet Commonly known as:  PEPCID Take 40 mg by mouth 2 (two) times daily.   furosemide 40 MG tablet Commonly known as:  LASIX Take 1 tablet (40 mg total) by mouth daily. For 5 days then stop.   lisinopril 2.5 MG tablet Commonly known as:  PRINIVIL,ZESTRIL Take 1 tablet (2.5 mg total) by mouth daily. What  changed:    medication strength  how much to take   metoprolol tartrate 25 MG tablet Commonly known as:  LOPRESSOR Take 1 tablet (25 mg total) by mouth 2 (two) times daily.   Potassium Chloride ER 20 MEQ Tbcr Take 20 mEq by mouth daily. For 5 days then stop.   traMADol 50 MG tablet Commonly known as:  ULTRAM Take 50 mg by mouth every 4-6 hours PRN severe pain      Follow-up Information    Ivin Poot, MD Follow up.   Specialty:  Cardiothoracic Surgery Why:  Appointment to see your surgeon on 01/05/2018 at 1:30 PM.  Please obtain a chest x-ray at [facility] at 1 PM.  [facility] imaging is located in the same office complex on the first floor. Contact information: [address] [address], [place], [place]. Go on 12/27/2017.   Specialty:  Cardiology Why:  Appointment time is at 9:30 am Contact information: [place] [address] [phone]           The patient has been discharged on:   1.Beta Blocker:  Yes Blue.Reese   ]  No   [   ]                              If No, reason:  2.Ace Inhibitor/ARB: Satchel.Pong [   ]                                     No  [    ]                                     If No, reason: BP  3.Statin:   Yes Blue.Reese   ]                  No  [   ]                  If No, reason:  4.Ecasa:  Yes  [ y  ]                  No   [   ]                  If No, reason:   Signed: Arnoldo Lenis 12/12/2017, 12:32 PM

## 2017-12-09 NOTE — Progress Notes (Addendum)
[facility]       [place] [address]             [phone]      2 Days Post-Op Procedure(s) (LRB): CORONARY ARTERY BYPASS GRAFTING (CABG) TIMES THREE USING LEFT INTERNAL MAMMARY ARTERY AND ENDOSCOPICALLY HARVESTED RIGHT SAPHENOUS VEIN. (N/A) TRANSESOPHAGEAL ECHOCARDIOGRAM (TEE) (N/A) ENDOVEIN HARVEST OF GREATER SAPHENOUS VEIN (Right) Subjective: Feeling better, pain pretty well controlled, some "belching" causing some discomfort  Objective: Vital signs in last 24 hours: Temp:  [97.7 F (36.5 C)-98.6 F (37 C)] 98.5 F (36.9 C) (10/31 0400) Pulse Rate:  [73-88] 77 (10/31 0400) Cardiac Rhythm: Normal sinus rhythm (10/31 0420) Resp:  [14-23] 17 (10/31 0400) BP: (87-106)/(52-72) 87/52 (10/31 0400) SpO2:  [88 %-100 %] 93 % (10/31 0400) Arterial Line BP: (89-107)/(53-66) 95/59 (10/30 1400) Weight:  [67.7 kg] 67.7 kg (10/31 0600)  Hemodynamic parameters for last 24 hours: PAP: (24)/(11) 24/11  Intake/Output from previous day: 10/30 0701 - 10/31 0700 In: 839.4 [P.O.:480; I.V.:59.3; IV Piggyback:300.1] Out: 1525 [Urine:1175; Chest Tube:350] Intake/Output this shift: No intake/output data recorded.  General appearance: alert, cooperative and no distress Heart: regular rate and rhythm Lungs: di in bases Abdomen: soft, nontender Extremities: minor edema Wound: incis healing well  Lab Results: Recent Labs    12/08/17 1723 12/08/17 1724 12/09/17 0636  WBC 10.0  --  9.3  HGB 10.5* 10.2* 10.1*  HCT 32.9* 30.0* 31.3*  PLT 180  --  171   BMET:  Recent Labs    12/08/17 0507  12/08/17 1724 12/09/17 0636  NA 139  --  137 135  K 4.3  --  4.3 4.0  CL 110  --  102 104  CO2 22  --   --  27  GLUCOSE 108*  --  167* 119*  BUN 7  --  11 14  CREATININE 0.55   < > 0.60 0.66  CALCIUM 8.3*  --   --  8.1*   < > = values in this interval not displayed.    PT/INR:  Recent Labs    12/07/17 1411  LABPROT 16.8*  INR 1.38   ABG    Component Value  Date/Time   PHART 7.339 (L) 12/08/2017 0746   HCO3 22.9 12/08/2017 0746   TCO2 24 12/08/2017 1724   ACIDBASEDEF 3.0 (H) 12/08/2017 0746   O2SAT 93.0 12/08/2017 0746   CBG (last 3)  Recent Labs    12/08/17 0314 12/08/17 0746 12/08/17 1545  GLUCAP 98 106* 138*    Meds Scheduled Meds: . acetaminophen  1,000 mg Oral Q6H   Or  . acetaminophen (TYLENOL) oral liquid 160 mg/5 mL  1,000 mg Per Tube Q6H  . aspirin EC  325 mg Oral Daily   Or  . aspirin  324 mg Per Tube Daily  . atorvastatin  40 mg Oral q1800  . bisacodyl  10 mg Oral Daily   Or  . bisacodyl  10 mg Rectal Daily  . cyclobenzaprine  5 mg Oral QHS  . docusate sodium  200 mg Oral Daily  . DULoxetine  30 mg Oral Daily  . furosemide  20 mg Intravenous BID  . ketorolac  15 mg Intravenous Q6H  . metoprolol tartrate  12.5 mg Oral BID   Or  . metoprolol tartrate  12.5 mg Per Tube BID  . pantoprazole  40 mg Oral Daily  . sodium chloride flush  3 mL Intravenous Q12H   Continuous Infusions: .  sodium chloride    . lactated ringers    . nitroGLYCERIN Stopped (12/07/17 1731)  . phenylephrine (NEO-SYNEPHRINE) Adult infusion 10 mcg/min (12/07/17 2200)   PRN Meds:.metoprolol tartrate, midazolam, morphine injection, ondansetron (ZOFRAN) IV, oxyCODONE, sodium chloride flush, traMADol  Xrays Dg Chest Port 1 View  Result Date: 12/08/2017 CLINICAL DATA:  Follow-up coronary bypass grafting EXAM: PORTABLE CHEST 1 VIEW COMPARISON:  12/07/2017 FINDINGS: Cardiac shadow remains enlarged. Postsurgical changes are again seen. Mediastinal drain and left thoracostomy catheter are again noted stable. The endotracheal tube and nasogastric catheter have been removed in the interval. Swan-Ganz catheter is coiled upon itself within the pulmonary outflow tract. The overall inspiratory effort is poor with mild right basilar atelectasis. No pneumothorax is seen. IMPRESSION: Poor inspiratory effort with right basilar atelectasis. Tubes and lines as  described. Electronically Signed   By: Inez Catalina M.D.   On: 12/08/2017 10:36   Dg Chest Portable 1 View  Result Date: 12/07/2017 CLINICAL DATA:  Status post coronary artery bypass graft. EXAM: PORTABLE CHEST 1 VIEW COMPARISON:  Radiographs of December 03, 2017. FINDINGS: Endotracheal tube is in grossly good position. Distal tip of nasogastric tube is seen in proximal stomach. Left-sided chest tube is noted without pneumothorax. Right internal jugular Swan-Ganz catheter is seen looped in the expected position of main pulmonary artery. Right lung is unremarkable. Bony thorax is unremarkable. IMPRESSION: Endotracheal tube in good position. Distal tip of nasogastric tube seen in proximal stomach. Right internal jugular catheter tip is looped within expected position of main pulmonary artery. Left-sided chest tube is noted without pneumothorax. Electronically Signed   By: Marijo Conception, M.D.   On: 12/07/2017 13:47    Assessment/Plan: S/P Procedure(s) (LRB): CORONARY ARTERY BYPASS GRAFTING (CABG) TIMES THREE USING LEFT INTERNAL MAMMARY ARTERY AND ENDOSCOPICALLY HARVESTED RIGHT SAPHENOUS VEIN. (N/A) TRANSESOPHAGEAL ECHOCARDIOGRAM (TEE) (N/A) ENDOVEIN HARVEST OF GREATER SAPHENOUS VEIN (Right)  1 conts to progress well 2 Hemodyn stable, SBP runs a bit low- no ACE-I/ARB for now 3 volume overload- wt up about 5 KG- cont to diurese 4 no air leak, CT drainage improving- tube to ve removed today 5 ABL anemia is stable 6 no leukocytosis 7 no thrombocytopenia 8 wean O2, pulm toilet- fas bibasilat atx 9 BS control is pretty good hor inpatient- A1C is 5.6 10 routine rehab  LOS: 5 days    John Giovanni PA-C 12/09/2017 Pager [phone]   Low lung volumes with poor incentive spirometry High O2 requirements Soft BP with need for increased diuresis- stsa low dose dopamine DC chest tubes and work on IS  patient examined and medical record reviewed,agree with above note. Tharon Aquas Trigt  III 12/09/2017

## 2017-12-09 NOTE — Progress Notes (Signed)
      [address]       [place] [address]             [phone]      POD # 2 CABG x 3 Resting comfortably BP 110/66   Pulse 89   Temp 98 F (36.7 C) (Oral)   Resp 12   Ht 5\' 2"  (1.575 m)   Wt 67.7 kg   SpO2 95%   BMI 27.30 kg/m   Intake/Output Summary (Last 24 hours) at 12/09/2017 1843 Last data filed at 12/09/2017 1700 Gross per 24 hour  Intake 551.03 ml  Output 1485 ml  Net -933.97 ml   No PM labs  Continue current care  Rebekkah Powless C. Roxan Hockey, MD [facility] [phone]

## 2017-12-09 NOTE — Progress Notes (Signed)
RN aware to DC central line 

## 2017-12-10 ENCOUNTER — Inpatient Hospital Stay (HOSPITAL_COMMUNITY): Payer: Commercial Managed Care - PPO

## 2017-12-10 ENCOUNTER — Encounter (HOSPITAL_COMMUNITY): Payer: Self-pay

## 2017-12-10 LAB — BPAM RBC
Blood Product Expiration Date: 201911222359
Blood Product Expiration Date: 201911252359
Blood Product Expiration Date: 201911272359
Blood Product Expiration Date: 201911272359
Blood Product Expiration Date: 201911282359
Blood Product Expiration Date: 201911282359
ISSUE DATE / TIME: 201910262153
ISSUE DATE / TIME: 201910290947
ISSUE DATE / TIME: 201910290947
ISSUE DATE / TIME: 201910291033
ISSUE DATE / TIME: 201910291033
Unit Type and Rh: 6200
Unit Type and Rh: 6200
Unit Type and Rh: 6200
Unit Type and Rh: 6200
Unit Type and Rh: 6200
Unit Type and Rh: 6200

## 2017-12-10 LAB — BASIC METABOLIC PANEL
Anion gap: 4 — ABNORMAL LOW (ref 5–15)
BUN: 12 mg/dL (ref 6–20)
CO2: 28 mmol/L (ref 22–32)
Calcium: 8.4 mg/dL — ABNORMAL LOW (ref 8.9–10.3)
Chloride: 105 mmol/L (ref 98–111)
Creatinine, Ser: 0.61 mg/dL (ref 0.44–1.00)
GFR calc Af Amer: >60 mL/min (ref >60)
GFR calc non Af Amer: >60 mL/min (ref >60)
Glucose, Bld: 111 mg/dL — ABNORMAL HIGH (ref 70–99)
Potassium: 3.7 mmol/L (ref 3.5–5.1)
Sodium: 137 mmol/L (ref 135–145)

## 2017-12-10 LAB — CBC
HCT: 31 % — ABNORMAL LOW (ref 36.0–46.0)
Hemoglobin: 9.7 g/dL — ABNORMAL LOW (ref 12.0–15.0)
MCH: 27.5 pg (ref 26.0–34.0)
MCHC: 31.3 g/dL (ref 30.0–36.0)
MCV: 87.8 fL (ref 80.0–100.0)
Platelets: 189 10*3/uL (ref 150–400)
RBC: 3.53 MIL/uL — ABNORMAL LOW (ref 3.87–5.11)
RDW: 14.1 % (ref 11.5–15.5)
WBC: 8.7 10*3/uL (ref 4.0–10.5)
nRBC: 0 % (ref 0.0–0.2)

## 2017-12-10 LAB — TYPE AND SCREEN
ABO/RH(D): AB POS
Antibody Screen: NEGATIVE
Unit division: 0
Unit division: 0
Unit division: 0
Unit division: 0
Unit division: 0
Unit division: 0

## 2017-12-10 MED ORDER — POTASSIUM CHLORIDE CRYS ER 20 MEQ PO TBCR
20.0000 meq | EXTENDED_RELEASE_TABLET | Freq: Once | ORAL | Status: AC
  Administered 2017-12-10: 20 meq via ORAL
  Filled 2017-12-10: qty 1

## 2017-12-10 NOTE — Op Note (Signed)
NAME: [name], [name]:[id number] ACCOUNT [id number] DATE OF BIRTH:11/17/1960 FACILITY: [facility] LOCATION: [facility] PHYSICIAN:Aadarsh Cozort VAN TRIGT III, MD  OPERATIVE REPORT  DATE OF PROCEDURE:  12/07/2017  OPERATION: 1.  Coronary artery bypass grafting x3 (left internal mammary artery to left anterior descending, saphenous vein graft to obtuse marginal 2, saphenous vein graft to right coronary artery). 2.  Endoscopic harvest of right leg greater saphenous vein.  SURGEON:  Ivin Poot, MD  ASSISTANT:  Jadene Pierini, PA-C  PREOPERATIVE DIAGNOSES:  Severe 3-vessel coronary artery disease with unstable angina and preserved ventricular function.  POSTOPERATIVE DIAGNOSES:  Severe 3-vessel coronary artery disease with unstable angina and preserved ventricular function.  ANESTHESIA:  General.  CLINICAL NOTE:  The patient is a very nice 57 year old hypertensive, reformed smoker who presents with symptoms of unstable angina.  Cardiac CT scan was performed which showed probable significant CAD.  Subsequent cardiac catheterization showed severe  3-vessel coronary artery disease with 80% stenosis of the descending, OM2, and RCA.  LV function was fairly well preserved.  She was recommended for CABG by her cardiologist.  After reviewing the patient's coronary angiograms and examining the patient,   I agreed with the recommendation for CABG as the best long-term therapy for her coronary artery disease.  I discussed the details of CABG with the patient and family including the use of general anesthesia and cardiopulmonary bypass, the location of the  surgical incisions, and the expected postoperative hospital recovery.  I discussed the risks of the surgery involved including risk of stroke, bleeding, blood transfusion requirement, postoperative pulmonary problems including pleural effusion,  postoperative infection, postoperative organ failure, and postoperative death.  After reviewing  these issues, she demonstrated her understanding and agreed to proceed with surgery under what I felt was informed consent.  OPERATIVE FINDINGS: 1.  Small but graftable target vessel. 2.  Small left IMA, but with adequate flow. 3.  Intraoperative anemia requiring 1 unit of packed cell transfusion.  DESCRIPTION OF PROCEDURE:  The patient was brought from preop holding to the operating room and placed supine on the operating table where general anesthesia was induced under invasive hemodynamic monitoring.  A transesophageal echo probe was placed by  the anesthesia team.  The patient's chest, abdomen and legs were prepped and draped as a sterile field.  A proper time-out was performed.  A sternal incision was made as the saphenous vein was harvested endoscopically from the right leg.  The left  internal mammary artery was harvested as a pedicle graft.  It was a small 1.2 mm vessel with adequate flow.  The sternal retractor was placed, and the pericardium was opened and suspended.  The aorta was inspected and palpated and was without  calcification or pathology.  After heparin was administered, pursestrings were placed in the ascending aorta and right atrium.  The patient was cannulated and placed on bypass.  The coronaries were identified for grafting, and the mammary artery and vein  grafts were prepared for the distal anastomoses.  Cardioplegic cannulas were placed both antegrade and retrograde cold blood cardioplegia.  The patient was cooled to 32 degrees, and the aortic crossclamp was applied.  A liter of cold blood cardioplegia  was delivered in split doses between the antegrade and retrograde cardioplegic catheters.  Good cardioplegic arrest was achieved with supple temperature drop to less than 14 degrees.  Cardioplegia was delivered every 20 minutes while the cross clamp was  in place.  The distal coronary anastomoses were performed.  First, distal anastomosis was to the distal RCA.  The  posterior descending and posterolateral were very small and there was only minimal disease at the distal RCA where the vein was sewn end-to-side with  running 7-0 Prolene with good flow through the graft.  Cardioplegia was redosed.  The second distal anastomosis was the OM2.  This was 1.4 mm vessel, proximal 80% stenosis.  A reverse saphenous vein was sewn end-to-side with running 7-0 Prolene with good flow through the graft.  Cardioplegia was redosed.  The third distal anastomosis was the distal third of the LAD.  It was 1.4 mm vessel with an approximately 80% stenosis.  The left IMA pedicle was brought through an opening and the left lateral pericardium was brought down onto the LAD and sewn  end-to-side with running 8-0 Prolene.  There was good flow through the anastomosis after briefly releasing the pedicle bulldog and the mammary artery.  The bulldog was reapplied, and a pedicle secured to the epicardium with 6-0 Prolene sutures.   Cardioplegia was redosed.  While the cross clamp was still in place, the 2 proximal vein anastomoses were performed using a 6-0 running Prolene in a 4.5 mm punch.  Prior to tying down the final proximal anastomosis, air was vented from the coronaries with a dose of retrograde warm  blood cardioplegia.  The crossclamp was removed.  The heart resumed a spontaneous rhythm.  The vein grafts were deaired and opened and each had good flow.  Hemostasis was documented at the proximal and distal anastomoses.  The patient was rewarmed and reperfused.  Temporary pacing wires were applied.   The superior pericardial fat was closed over the aorta.  Anterior mediastinal and left pleural chest tubes were placed and brought out through separate  incisions.  The sternum was closed with a wire.  The pectoralis fascia was closed with a running #1  Vicryl.  Subcutaneous and skin layers were closed with running Vicryl and sterile dressings were applied.    Total cardiopulmonary bypass  time was 121 minutes.  AN/NUANCE  D:12/09/2017 T:12/10/2017 JOB:[id number]

## 2017-12-10 NOTE — Progress Notes (Signed)
Transferred-in from 2H by wheelchair awake and alert 

## 2017-12-10 NOTE — Progress Notes (Signed)
3 Days Post-Op Procedure(s) (LRB): CORONARY ARTERY BYPASS GRAFTING (CABG) TIMES THREE USING LEFT INTERNAL MAMMARY ARTERY AND ENDOSCOPICALLY HARVESTED RIGHT SAPHENOUS VEIN. (N/A) TRANSESOPHAGEAL ECHOCARDIOGRAM (TEE) (N/A) ENDOVEIN HARVEST OF GREATER SAPHENOUS VEIN (Right) Subjective: Feels weak Poor IS with low lung volumes on CXR walking in hall now Wt down 2 lbs  Objective: Vital signs in last 24 hours: Temp:  [97.6 F (36.4 C)-98.3 F (36.8 C)] 97.9 F (36.6 C) (11/01 0700) Pulse Rate:  [29-102] 92 (11/01 0801) Cardiac Rhythm: Normal sinus rhythm (11/01 0822) Resp:  [11-28] 16 (11/01 0800) BP: (61-110)/(47-84) 106/84 (11/01 0801) SpO2:  [84 %-100 %] 95 % (11/01 0800) Weight:  [64.7 kg] 64.7 kg (11/01 0500)  Hemodynamic parameters for last 24 hours:  nsr  Intake/Output from previous day: 10/31 0701 - 11/01 0700 In: 1052.6 [P.O.:745; I.V.:268.4; IV Piggyback:39.2] Out: 1380 [Urine:1330; Chest Tube:50] Intake/Output this shift: Total I/O In: 60 [P.O.:60] Out: -        Exam    General- alert and depressed    Neck- no JVD, no cervical adenopathy palpable, no carotid bruit   Lungs- clear but decreased at bases   Cor- regular rate and rhythm, no murmur , gallop   Abdomen- soft, non-tender   Extremities - warm, non-tender, minimal edema   Neuro- oriented, appropriate, no focal weakness   Lab Results: Recent Labs    12/09/17 0636 12/10/17 0307  WBC 9.3 8.7  HGB 10.1* 9.7*  HCT 31.3* 31.0*  PLT 171 189   BMET:  Recent Labs    12/09/17 0636 12/10/17 0307  NA 135 137  K 4.0 3.7  CL 104 105  CO2 27 28  GLUCOSE 119* 111*  BUN 14 12  CREATININE 0.66 0.61  CALCIUM 8.1* 8.4*    PT/INR:  Recent Labs    12/07/17 1411  LABPROT 16.8*  INR 1.38   ABG    Component Value Date/Time   PHART 7.339 (L) 12/08/2017 0746   HCO3 22.9 12/08/2017 0746   TCO2 24 12/08/2017 1724   ACIDBASEDEF 3.0 (H) 12/08/2017 0746   O2SAT 93.0 12/08/2017 0746   CBG (last 3)   Recent Labs    12/08/17 0314 12/08/17 0746 12/08/17 1545  GLUCAP 98 106* 138*    Assessment/Plan: S/P Procedure(s) (LRB): CORONARY ARTERY BYPASS GRAFTING (CABG) TIMES THREE USING LEFT INTERNAL MAMMARY ARTERY AND ENDOSCOPICALLY HARVESTED RIGHT SAPHENOUS VEIN. (N/A) TRANSESOPHAGEAL ECHOCARDIOGRAM (TEE) (N/A) ENDOVEIN HARVEST OF GREATER SAPHENOUS VEIN (Right) tx stepdown Wean off dopamine Ambulate, IS  LOS: 6 days    [name] 12/10/2017

## 2017-12-10 NOTE — Progress Notes (Signed)
Pt is doing well post CABG. She has been ambulating.  Hemodynamically stable. Sinus tach. Please call over the weekend if we can be of assistance.    Lauree Chandler 12/10/2017 8:34 AM

## 2017-12-11 LAB — CBC
HCT: 31.9 % — ABNORMAL LOW (ref 36.0–46.0)
Hemoglobin: 10.4 g/dL — ABNORMAL LOW (ref 12.0–15.0)
MCH: 28 pg (ref 26.0–34.0)
MCHC: 32.6 g/dL (ref 30.0–36.0)
MCV: 85.8 fL (ref 80.0–100.0)
Platelets: 209 10*3/uL (ref 150–400)
RBC: 3.72 MIL/uL — ABNORMAL LOW (ref 3.87–5.11)
RDW: 13.9 % (ref 11.5–15.5)
WBC: 7.9 10*3/uL (ref 4.0–10.5)
nRBC: 0 % (ref 0.0–0.2)

## 2017-12-11 LAB — BASIC METABOLIC PANEL
Anion gap: 5 (ref 5–15)
BUN: 12 mg/dL (ref 6–20)
CO2: 28 mmol/L (ref 22–32)
Calcium: 8.5 mg/dL — ABNORMAL LOW (ref 8.9–10.3)
Chloride: 105 mmol/L (ref 98–111)
Creatinine, Ser: 0.66 mg/dL (ref 0.44–1.00)
GFR calc Af Amer: >60 mL/min (ref >60)
GFR calc non Af Amer: >60 mL/min (ref >60)
Glucose, Bld: 100 mg/dL — ABNORMAL HIGH (ref 70–99)
Potassium: 3.2 mmol/L — ABNORMAL LOW (ref 3.5–5.1)
Sodium: 138 mmol/L (ref 135–145)

## 2017-12-11 MED ORDER — POTASSIUM CHLORIDE 20 MEQ/15ML (10%) PO SOLN
30.0000 meq | Freq: Once | ORAL | Status: AC
  Administered 2017-12-11: 30 meq via ORAL
  Filled 2017-12-11: qty 30

## 2017-12-11 MED ORDER — POTASSIUM CHLORIDE 20 MEQ/15ML (10%) PO SOLN
ORAL | Status: AC
  Administered 2017-12-11: 30 meq via ORAL
  Filled 2017-12-11: qty 30

## 2017-12-11 MED ORDER — POTASSIUM CHLORIDE CRYS ER 20 MEQ PO TBCR: 30.0000 meq | EXTENDED_RELEASE_TABLET | Freq: Once | ORAL | Status: DC

## 2017-12-11 MED ORDER — POTASSIUM CHLORIDE CRYS ER 20 MEQ PO TBCR
40.0000 meq | EXTENDED_RELEASE_TABLET | Freq: Once | ORAL | Status: AC
  Administered 2017-12-11: 40 meq via ORAL
  Filled 2017-12-11: qty 2

## 2017-12-11 MED ORDER — FLUTICASONE PROPIONATE 50 MCG/ACT NA SUSP
1.0000 | Freq: Two times a day (BID) | NASAL | Status: DC
  Administered 2017-12-11 – 2017-12-12 (×2): 1 via NASAL
  Filled 2017-12-11: qty 16

## 2017-12-11 MED ORDER — CETIRIZINE HCL 5 MG/5ML PO SOLN
5.0000 mg | Freq: Two times a day (BID) | ORAL | Status: DC | PRN
  Administered 2017-12-11: 5 mg via ORAL
  Filled 2017-12-11 (×2): qty 5

## 2017-12-11 MED ORDER — POTASSIUM CHLORIDE CRYS ER 10 MEQ PO TBCR: 30.0000 meq | EXTENDED_RELEASE_TABLET | Freq: Once | ORAL | Status: DC

## 2017-12-11 MED ORDER — METOPROLOL TARTRATE 12.5 MG HALF TABLET
12.5000 mg | ORAL_TABLET | Freq: Two times a day (BID) | ORAL | Status: DC
  Administered 2017-12-11 – 2017-12-12 (×3): 12.5 mg via ORAL
  Filled 2017-12-11 (×3): qty 1

## 2017-12-11 MED ORDER — GUAIFENESIN ER 600 MG PO TB12: 600.0000 mg | ORAL_TABLET | Freq: Two times a day (BID) | ORAL | Status: DC

## 2017-12-11 NOTE — Progress Notes (Addendum)
Telephone order given for mucinex , zyrtec and nasal spray. from [facility]. For patients complaints .

## 2017-12-11 NOTE — Progress Notes (Signed)
Pacer wires removed with no distress noted. Patient verbalized understanding of bed rest. Family at bedside. Walked patient 400 ft prior to pulling of wires. Also voided X1 prior to pulling wires. Will continue to monitor.

## 2017-12-11 NOTE — Progress Notes (Addendum)
      [address]       [place] [address]             [phone]        4 Days Post-Op Procedure(s) (LRB): CORONARY ARTERY BYPASS GRAFTING (CABG) TIMES THREE USING LEFT INTERNAL MAMMARY ARTERY AND ENDOSCOPICALLY HARVESTED RIGHT SAPHENOUS VEIN. (N/A) TRANSESOPHAGEAL ECHOCARDIOGRAM (TEE) (N/A) ENDOVEIN HARVEST OF GREATER SAPHENOUS VEIN (Right)  Subjective: Patient had 2 bowel movement this am. She has no specific complaints.  Objective: Vital signs in last 24 hours: Temp:  [98.3 F (36.8 C)-98.8 F (37.1 C)] 98.8 F (37.1 C) (11/02 0811) Pulse Rate:  [93-109] 96 (11/02 0811) Cardiac Rhythm: Normal sinus rhythm (11/02 0711) Resp:  [12-27] 12 (11/02 0811) BP: (114-133)/(72-95) 124/95 (11/02 0811) SpO2:  [89 %-98 %] 95 % (11/02 0811) Weight:  [63.3 kg] 63.3 kg (11/02 0500)  Pre op weight 62.3 kg Current Weight  12/11/17 63.3 kg      Intake/Output from previous day: 11/01 0701 - 11/02 0700 In: 498.8 [P.O.:360; I.V.:138.8] Out: 900 [Urine:900]   Physical Exam:  Cardiovascular: Slightly tachy Pulmonary: Slightly diminished at bases Abdomen: Soft, non tender, bowel sounds present. Extremities: Trace bilateral lower extremity edema. Wounds: Clean and dry.  No erythema or signs of infection.  Lab Results: CBC: Recent Labs    12/10/17 0307 12/11/17 0400  WBC 8.7 7.9  HGB 9.7* 10.4*  HCT 31.0* 31.9*  PLT 189 209   BMET:  Recent Labs    12/10/17 0307 12/11/17 0400  NA 137 138  K 3.7 3.2*  CL 105 105  CO2 28 28  GLUCOSE 111* 100*  BUN 12 12  CREATININE 0.61 0.66  CALCIUM 8.4* 8.5*    PT/INR:  Lab Results  Component Value Date   INR 1.38 12/07/2017   INR 1.02 12/06/2017   ABG:  INR: Will add last result for INR, ABG once components are confirmed Will add last 4 CBG results once components are confirmed  Assessment/Plan:  1. CV - SR with HR in the 90's this am. She was on Lopressor 12.5 mg bid but not on MAR this am-will  resume (no bradycardia or significant AV block). Will restart Plavix day of discharge and decrease ecasa to 81 mg daily. 2.  Pulmonary - On room air. Encourage incentive spirometer.  3. Volume Overload - On Lasix 20 mg IV bid 4.  Acute blood loss anemia - H and H this am stable at 10.4 and 31.9 5. Supplement potassium 6. Remove EPW 7. Hope to discharge in am  Donielle M ZimmermanPA-C 12/11/2017,11:38 AM    Chart reviewed, patient examined, agree with above. She says she feels great. Plan home tomorrow if no changes. [phone]

## 2017-12-11 NOTE — Progress Notes (Signed)
Pt has walked x2 today with her husband, around 400 ft, without RW. Doing well, now resting in bed. She will walk more later. Ed completed with pt and husband with good reception. Will refer to [place]. Gave d/c video to view. [place], ACSM 1:43 PM 12/11/2017

## 2017-12-12 MED ORDER — FUROSEMIDE 40 MG PO TABS: 40.0000 mg | ORAL_TABLET | Freq: Every day | ORAL | 0 refills | Status: DC

## 2017-12-12 MED ORDER — METOPROLOL TARTRATE 25 MG PO TABS: 25.0000 mg | ORAL_TABLET | Freq: Two times a day (BID) | ORAL | 1 refills | Status: DC

## 2017-12-12 MED ORDER — LISINOPRIL 2.5 MG PO TABS
2.5000 mg | ORAL_TABLET | Freq: Every day | ORAL | Status: DC
  Administered 2017-12-12: 2.5 mg via ORAL
  Filled 2017-12-12: qty 1

## 2017-12-12 MED ORDER — ATORVASTATIN CALCIUM 40 MG PO TABS: 40.0000 mg | ORAL_TABLET | ORAL | 1 refills | Status: DC

## 2017-12-12 MED ORDER — POTASSIUM CHLORIDE ER 20 MEQ PO TBCR: 20.0000 meq | EXTENDED_RELEASE_TABLET | Freq: Every day | ORAL | 0 refills | Status: DC

## 2017-12-12 MED ORDER — ASPIRIN EC 81 MG PO TBEC: 81.0000 mg | DELAYED_RELEASE_TABLET | Freq: Every day | ORAL | Status: DC

## 2017-12-12 MED ORDER — METOPROLOL TARTRATE 25 MG PO TABS: 25.0000 mg | ORAL_TABLET | Freq: Two times a day (BID) | ORAL | Status: DC

## 2017-12-12 MED ORDER — CLOPIDOGREL BISULFATE 75 MG PO TABS: 75.0000 mg | ORAL_TABLET | Freq: Every day | ORAL | 1 refills | Status: DC

## 2017-12-12 MED ORDER — LISINOPRIL 2.5 MG PO TABS: 2.5000 mg | ORAL_TABLET | Freq: Every day | ORAL | 1 refills | Status: DC

## 2017-12-12 MED ORDER — ASPIRIN 325 MG PO TBEC: 325.0000 mg | DELAYED_RELEASE_TABLET | Freq: Every day | ORAL | 0 refills | Status: DC

## 2017-12-12 MED ORDER — TRAMADOL HCL 50 MG PO TABS: ORAL_TABLET | ORAL | 0 refills | Status: DC

## 2017-12-12 NOTE — Progress Notes (Signed)
Removed chest tube stitches out x 2. Removed mid line with intact needle. Pt understood that discharge instructions and received prescriptions. Pt's husband took her all belongings. HS [employer]

## 2017-12-12 NOTE — Progress Notes (Addendum)
      [address]       [place] [address]             [phone]        5 Days Post-Op Procedure(s) (LRB): CORONARY ARTERY BYPASS GRAFTING (CABG) TIMES THREE USING LEFT INTERNAL MAMMARY ARTERY AND ENDOSCOPICALLY HARVESTED RIGHT SAPHENOUS VEIN. (N/A) TRANSESOPHAGEAL ECHOCARDIOGRAM (TEE) (N/A) ENDOVEIN HARVEST OF GREATER SAPHENOUS VEIN (Right)  Subjective: Patient had 2 bowel movement this am. She has no specific complaints.  Objective: Vital signs in last 24 hours: Temp:  [98.5 F (36.9 C)-99.3 F (37.4 C)] 99.1 F (37.3 C) (11/03 0905) Pulse Rate:  [97-112] 99 (11/03 0905) Cardiac Rhythm: Normal sinus rhythm (11/03 0905) Resp:  [15-25] 20 (11/03 0905) BP: (102-158)/(58-98) 149/96 (11/03 0905) SpO2:  [93 %-94 %] 93 % (11/03 0905)  Pre op weight 62.3 kg Current Weight  12/11/17 63.3 kg      Intake/Output from previous day: 11/02 0701 - 11/03 0700 In: 6 [I.V.:6] Out: -    Physical Exam:  Cardiovascular: Slightly tachy Pulmonary: Slightly diminished at bases Abdomen: Soft, non tender, bowel sounds present. Extremities: Trace bilateral lower extremity edema. Wounds: Clean and dry.  No erythema or signs of infection.  Lab Results: CBC: Recent Labs    12/10/17 0307 12/11/17 0400  WBC 8.7 7.9  HGB 9.7* 10.4*  HCT 31.0* 31.9*  PLT 189 209   BMET:  Recent Labs    12/10/17 0307 12/11/17 0400  NA 137 138  K 3.7 3.2*  CL 105 105  CO2 28 28  GLUCOSE 111* 100*  BUN 12 12  CREATININE 0.61 0.66  CALCIUM 8.4* 8.5*    PT/INR:  Lab Results  Component Value Date   INR 1.38 12/07/2017   INR 1.02 12/06/2017   ABG:  INR: Will add last result for INR, ABG once components are confirmed Will add last 4 CBG results once components are confirmed  Assessment/Plan:  1. CV - SR with HR in the 90's this am. On Lopressor 12.5 mg bid but will increase for better HR and BP control. Will restart low dose Lisinopril and decrease ecasa to 81 mg  daily. Per Dr. Prescott Gum, start Plavix 75 mg daily (ACS) 2.  Pulmonary - On room air. Encourage incentive spirometer.  3. Volume Overload - On Lasix 20 mg IV bid. Will give orally for a few days then stop 4.  Acute blood loss anemia - H and H this am stable at 10.4 and 31.9 5. Remove sutures 6. Discharge  Donielle M ZimmermanPA-C 12/12/2017,11:28 AM   Agree with above.  Doing well overall Plan home today.

## 2017-12-13 ENCOUNTER — Telehealth (HOSPITAL_COMMUNITY): Payer: Self-pay

## 2017-12-13 NOTE — Telephone Encounter (Signed)
Pt insurance is active and benefits verified through [facility]. Co-pay $0.00, DED $4,000.00/$4,000.00 met, out of pocket $6,000.00/$6,000.00 met, co-insurance 10%. No pre-authorization. Eve/[facility], 12/13/17 @ 3:11PM, REF# [id number]  Will contact patient to see if she is interested in the Cardiac Rehab Program. If interested, patient will need to complete follow up appt. Once completed, patient will be contacted for scheduling upon review by the RN Navigator.

## 2017-12-14 ENCOUNTER — Telehealth (HOSPITAL_COMMUNITY): Payer: Self-pay

## 2017-12-14 NOTE — Telephone Encounter (Signed)
Pt returned phone call in regards to Cardiac Rehab - pt is interested in the program. Explained scheduling process and went over insurance, pt verbalized understanding. Will contact pt for scheduling once f/u appt has been completed.

## 2017-12-14 NOTE — Telephone Encounter (Signed)
Attempted to call patient to see if she is interested in the Cardiac Rehab Program. [facility]

## 2017-12-23 ENCOUNTER — Ambulatory Visit (HOSPITAL_COMMUNITY)
Admission: RE | Admit: 2017-12-23 | Discharge: 2017-12-23 | Disposition: A | Discharge status: Home / Self Care | Payer: Commercial Managed Care - PPO | Source: Ambulatory Visit | Attending: Internal Medicine | Admitting: Internal Medicine

## 2017-12-23 ENCOUNTER — Other Ambulatory Visit: Payer: Self-pay

## 2017-12-23 ENCOUNTER — Encounter (HOSPITAL_COMMUNITY): Payer: Self-pay | Admitting: Internal Medicine

## 2017-12-23 VITALS — BP 100/71 | HR 66 | Wt 139.0 lb

## 2017-12-23 DIAGNOSIS — Z09 Encounter for follow-up examination after completed treatment for conditions other than malignant neoplasm: Secondary | ICD-10-CM | POA: Diagnosis not present

## 2017-12-23 DIAGNOSIS — Z7982 Long term (current) use of aspirin: Secondary | ICD-10-CM | POA: Diagnosis not present

## 2017-12-23 DIAGNOSIS — E785 Hyperlipidemia, unspecified: Secondary | ICD-10-CM | POA: Insufficient documentation

## 2017-12-23 DIAGNOSIS — K219 Gastro-esophageal reflux disease without esophagitis: Secondary | ICD-10-CM | POA: Insufficient documentation

## 2017-12-23 DIAGNOSIS — Z951 Presence of aortocoronary bypass graft: Secondary | ICD-10-CM | POA: Diagnosis not present

## 2017-12-23 DIAGNOSIS — I10 Essential (primary) hypertension: Secondary | ICD-10-CM | POA: Diagnosis not present

## 2017-12-23 DIAGNOSIS — Z7902 Long term (current) use of antithrombotics/antiplatelets: Secondary | ICD-10-CM | POA: Diagnosis not present

## 2017-12-23 DIAGNOSIS — Z79899 Other long term (current) drug therapy: Secondary | ICD-10-CM | POA: Insufficient documentation

## 2017-12-23 DIAGNOSIS — Z87891 Personal history of nicotine dependence: Secondary | ICD-10-CM | POA: Insufficient documentation

## 2017-12-23 DIAGNOSIS — Z882 Allergy status to sulfonamides status: Secondary | ICD-10-CM | POA: Insufficient documentation

## 2017-12-23 DIAGNOSIS — I2510 Atherosclerotic heart disease of native coronary artery without angina pectoris: Secondary | ICD-10-CM

## 2017-12-23 DIAGNOSIS — M8580 Other specified disorders of bone density and structure, unspecified site: Secondary | ICD-10-CM | POA: Insufficient documentation

## 2017-12-23 DIAGNOSIS — Z8249 Family history of ischemic heart disease and other diseases of the circulatory system: Secondary | ICD-10-CM | POA: Insufficient documentation

## 2017-12-23 DIAGNOSIS — F329 Major depressive disorder, single episode, unspecified: Secondary | ICD-10-CM | POA: Diagnosis not present

## 2017-12-23 MED ORDER — DULOXETINE HCL 60 MG PO CPEP: 60.0000 mg | ORAL_CAPSULE | Freq: Every day | ORAL | 3 refills | Status: DC

## 2017-12-23 NOTE — Progress Notes (Signed)
Advanced Heart Failure Clinic Note   Referring Physician: PCP: Terry Aly, FNP PCP-Cardiologist: Shirlee More, MD   HPI:  [name] is a 57 y.o. female with CAD s/p CABG x 3, HTN, and HLD.   Admitted 12/03/17 after an episode of CP and presyncope in the setting of severe emotional stress. Initial trop negative. Cardiac CT showed 3v CAD. 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 EF was normal by Echo as below. Seen by TCTS and recommended for CABG.   Pt underwent CABG x 3 on 12/07/17 including LIMA to LAD, SVG to RCA, and SVG to OM. Pt had a relatively uncomplicated post op course.  Epicardial pacing wires removed 12/11/17 and chest tube sutures removed 12/12/17. Started on plavix day of discharge.   Here for post-op f/u, Had some erythema around chest tube site and clicking in chest initially but now both have resolved. Feeling very good. Walking up to 15 minutes without a problem. No angina. Stamina improving. LE ecchymosis resolving. Still with some tingling in right leg. Tearful at times.   CT FFR 12/05/17 FFR 0.7 mid LAD -> hemodynamically significant proximal LAD stenosis FFR not significant in the LCx or RCA.  Echo 12/05/17 LVEF 60-65%, Grade 1 DD, Mild AI, Trivial MR, Trivial TR.  LHC 12/06/17  Ost RCA lesion is 60% stenosed.  Mid RCA-1 lesion is 50% stenosed.  Mid RCA-2 lesion is 80% stenosed.  Prox Cx to Mid Cx lesion is 80% stenosed.  Mid Cx to Dist Cx lesion is 50% stenosed.  Prox Cx lesion is 30% stenosed.  Ost LAD lesion is 80% stenosed.  Prox LAD lesion is 70% stenosed.  Mid LAD lesion is 30% stenosed.  The left ventricular systolic function is normal.  LV end diastolic pressure is normal.  The left ventricular ejection fraction is greater than 65% by visual  estimate.  There is no mitral valve regurgitation.  PFTs 12/06/17 FVC 2.81 (89%) FEV1 2.09 (85%)  Review of systems complete and found to be negative unless listed in HPI.    Past Medical History:  Diagnosis Date  . Acid reflux   . Fibroid   . Osteopenia 10/2016   T score -2.1 FRAX's 5.7%/0.3%    Current Outpatient Medications  Medication Sig Dispense Refill  . aspirin 81 MG tablet Take 1 tablet (81 mg total) by mouth daily.    Marland Kitchen atorvastatin (LIPITOR) 40 MG tablet Take 1 tablet (40 mg total) by mouth See admin instructions. Monday, Wednesday, Friday and Sunday 30 tablet 1  . clopidogrel (PLAVIX) 75 MG tablet Take 1 tablet (75 mg total) by mouth daily. 30 tablet 1  . DULoxetine (CYMBALTA) 60 MG capsule Take 30 mg by mouth daily.     . famotidine (PEPCID) 40 MG tablet Take 40 mg by mouth 2 (two) times daily.    . furosemide (LASIX) 40 MG tablet Take 1 tablet (40 mg total) by mouth daily. For 5 days then stop. 5 tablet 0  . lisinopril (PRINIVIL,ZESTRIL) 2.5 MG tablet Take 1 tablet (2.5 mg total) by mouth daily. 30 tablet 1  . metoprolol tartrate (LOPRESSOR) 25 MG tablet Take 1 tablet (25 mg total) by mouth 2 (two) times daily. 60 tablet 1  . potassium chloride 20 MEQ TBCR Take 20 mEq by mouth daily. For 5 days then stop. 5  tablet 0  . traMADol (ULTRAM) 50 MG tablet Take 50 mg by mouth every 4-6 hours PRN severe pain 28 tablet 0   No current facility-administered medications for this encounter.     Allergies  Allergen Reactions  . Sulfa Antibiotics Swelling      Social History   Socioeconomic History  . Marital status: Married    Spouse name: Not on file  . Number of children: Not on file  . Years of education: Not on file  . Highest education level: Not on file  Occupational History  . Not on file  Social Needs  . Financial resource strain: Not on file  . Food insecurity:    Worry: Not on file    Inability: Not on file  . Transportation needs:    Medical: Not on  file    Non-medical: Not on file  Tobacco Use  . Smoking status: Former Smoker    Last attempt to quit: 09/08/1994    Years since quitting: 23.3  . Smokeless tobacco: Never Used  Substance and Sexual Activity  . Alcohol use: Yes    Alcohol/week: 0.0 standard drinks    Comment: occ  . Drug use: No  . Sexual activity: Yes  Lifestyle  . Physical activity:    Days per week: Not on file    Minutes per session: Not on file  . Stress: Not on file  Relationships  . Social connections:    Talks on phone: Not on file    Gets together: Not on file    Attends religious service: Not on file    Active member of club or organization: Not on file    Attends meetings of clubs or organizations: Not on file    Relationship status: Not on file  . Intimate partner violence:    Fear of current or ex partner: Not on file    Emotionally abused: Not on file    Physically abused: Not on file    Forced sexual activity: Not on file  Other Topics Concern  . Not on file  Social History Narrative  . Not on file      Family History  Problem Relation Age of Onset  . Hypertension Mother   . Heart disease Mother   . Diabetes Father   . Hypertension Father   . Heart disease Father   . Diabetes Paternal Grandfather     Vitals:   12/23/17 1234  BP: 100/71  Pulse: 66  SpO2: 99%  Weight: 63 kg (139 lb)   Wt Readings from Last 3 Encounters:  12/23/17 63 kg (139 lb)  12/11/17 63.3 kg (139 lb 8 oz)  05/14/16 65.7 kg (144 lb 12.8 oz)    PHYSICAL EXAM: General:  Well appearing. No respiratory difficulty HEENT: normal Neck: supple. no JVD. Carotids 2+ bilat; no bruits. No lymphadenopathy or thyromegaly appreciated. Cor: PMI nondisplaced. Regular rate & rhythm. No rubs, gallops or murmurs Sternal wound and CT sites look great. Lungs: clear Abdomen: soft, nontender, nondistended. No hepatosplenomegaly. No bruits or masses. Good bowel sounds. Extremities: no cyanosis, clubbing, rash, edema  Mild  ecchymosis.Resolving  Neuro: alert & oriented x 3, cranial nerves grossly intact. moves all 4 extremities w/o difficulty. Affect pleasant.  ECG: 12/08/17 NSR 89 bpm  ASSESSMENT & PLAN:  1. CAD s/p CABG x 3, LIMA to LAD, SVG to RCA, SVG to OM - Overall doing well. No further angina.  - Operative sites look good.  - Sees Dr. Prescott Gum  in follow up 01/05/18 - Continue ASA, statin, and plavix.  - Will start CR soon.  2. HTN - Well controlled on current regimen - Continue lopressor 25 mg BID - Continue lisinopril 2.5 mg daily  3. Post-CABG depression - Increase cymbalta to 60 daily - Suggested seeing a counselor if not improving - Suspect CR will help as well   Glori Bickers, MD  6:58 PM

## 2017-12-23 NOTE — Patient Instructions (Signed)
INCREASE Cymbalta to 60mg  daily.  Follow up in 3 months with Dr.Bensimhon

## 2017-12-27 ENCOUNTER — Ambulatory Visit: Payer: Commercial Managed Care - PPO | Admitting: Physician Assistant

## 2017-12-28 ENCOUNTER — Encounter: Payer: Self-pay | Admitting: Physician Assistant

## 2017-12-28 ENCOUNTER — Ambulatory Visit (INDEPENDENT_AMBULATORY_CARE_PROVIDER_SITE_OTHER): Payer: Commercial Managed Care - PPO | Admitting: Physician Assistant

## 2017-12-28 VITALS — BP 118/82 | HR 72 | Ht 62.0 in | Wt 137.0 lb

## 2017-12-28 DIAGNOSIS — I2510 Atherosclerotic heart disease of native coronary artery without angina pectoris: Secondary | ICD-10-CM

## 2017-12-28 DIAGNOSIS — I10 Essential (primary) hypertension: Secondary | ICD-10-CM | POA: Diagnosis not present

## 2017-12-28 DIAGNOSIS — E785 Hyperlipidemia, unspecified: Secondary | ICD-10-CM

## 2017-12-28 DIAGNOSIS — I25700 Atherosclerosis of coronary artery bypass graft(s), unspecified, with unstable angina pectoris: Secondary | ICD-10-CM | POA: Diagnosis not present

## 2017-12-28 MED ORDER — NITROGLYCERIN 0.4 MG SL SUBL: 0.4000 mg | SUBLINGUAL_TABLET | SUBLINGUAL | 3 refills | Status: DC | PRN

## 2017-12-28 MED ORDER — ATORVASTATIN CALCIUM 40 MG PO TABS: 40.0000 mg | ORAL_TABLET | Freq: Every day | ORAL | 3 refills | Status: DC

## 2017-12-28 NOTE — Progress Notes (Signed)
Cardiology Office Note    Date:  12/28/2017   ID:  [name], DOB 03/28/1960, MRN [id number]  PCP:  Terry Aly, FNP  Cardiologist:  Dr. Burt Knack   Chief Complaint: Hospital follow up s/p   CABG  History of Present Illness:   [name] is a 57 y.o. female with CAD s/p CABG x 3, HTN, and HLD presents for follow up.   Admitted 12/03/17 after an episode of CP and presyncope in the setting of severe emotional stress. Initial trop negative. Cardiac CT showed 3v CAD. 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 EF was normal by Echo as below. Seen by TCTS and underwent CABG x 3 on 12/07/17 including LIMA to LAD, SVG to RCA, and SVG to OM. Pt had a relatively uncomplicated post op course.  Here today for follow up. The patient denies nausea, vomiting, fever, chest pain, palpitations, shortness of breath, orthopnea, PND, dizziness, syncope, cough, congestion, abdominal pain, hematochezia, melena, lower extremity edema. Walks 22 minutes/day without any issue.   Past Medical History:  Diagnosis Date  . Acid reflux   . Fibroid   . Osteopenia 10/2016   T score -2.1 FRAX's 5.7%/0.3%    Past Surgical History:  Procedure Laterality Date  . ABDOMINAL HYSTERECTOMY  2011   SUPRACERVICAL HYSTERECTOMY  . APPENDECTOMY    . CESAREAN SECTION     X3  . CORONARY ARTERY BYPASS GRAFT N/A 12/07/2017   Procedure: CORONARY ARTERY BYPASS GRAFTING (CABG) TIMES THREE USING LEFT INTERNAL MAMMARY ARTERY AND ENDOSCOPICALLY HARVESTED RIGHT SAPHENOUS VEIN.;  Surgeon: Terry Poot, MD;  Location: [facility];  Service: Open Heart Surgery;  Laterality: N/A;  . ENDOVEIN HARVEST OF GREATER SAPHENOUS VEIN Right 12/07/2017   Procedure: ENDOVEIN HARVEST OF GREATER SAPHENOUS VEIN;  Surgeon: Terry Poot, MD;   Location: [facility];  Service: Open Heart Surgery;  Laterality: Right;  . LEFT HEART CATH AND CORONARY ANGIOGRAPHY N/A 12/06/2017   Procedure: LEFT HEART CATH AND CORONARY ANGIOGRAPHY;  Surgeon: Terry Blanks, MD;  Location: [facility];  Service: Cardiovascular;  Laterality: N/A;  . TEE WITHOUT CARDIOVERSION N/A 12/07/2017   Procedure: TRANSESOPHAGEAL ECHOCARDIOGRAM (TEE);  Surgeon: Terry Gum, Collier Salina, MD;  Location: [facility];  Service: Open Heart Surgery;  Laterality: N/A;  . TONSILLECTOMY AND ADENOIDECTOMY      Current Medications: Prior to Admission medications   Medication Sig Start Date End Date Taking? Authorizing Provider  aspirin 81 MG tablet Take 1 tablet (81 mg total) by mouth daily. 12/13/17  Yes Terry Pinks M, PA-C  atorvastatin (LIPITOR) 40 MG tablet Take 1 tablet (40 mg total) by mouth See admin instructions. Monday, Wednesday, Friday and Sunday 12/12/17  Yes Terry Pinks M, PA-C  clopidogrel (PLAVIX) 75 MG tablet Take 1 tablet (75 mg total) by mouth daily. 12/12/17  Yes Terry Pinks M, PA-C  DULoxetine (CYMBALTA) 60 MG capsule Take 1 capsule (60 mg total) by mouth daily. 12/23/17  Yes Oliver, Terry Pascal, MD  famotidine (PEPCID) 40 MG tablet Take 40 mg by mouth 2 (two) times daily.   Yes [provider]  lisinopril (PRINIVIL,ZESTRIL) 2.5 MG tablet Take 1 tablet (2.5 mg total) by mouth daily. 12/12/17  Yes Terry Pinks M, PA-C  metoprolol tartrate (LOPRESSOR) 25 MG tablet Take 1 tablet (25 mg total) by  mouth 2 (two) times daily. 12/12/17  Yes Terry Pinks M, PA-C    Allergies:   Sulfa antibiotics   Social History   Socioeconomic History  . Marital status: Married    Spouse name: Not on file  . Number of children: Not on file  . Years of education: Not on file  . Highest education level: Not on file  Occupational History  . Not on file  Social Needs  . Financial resource strain: Not on file  . Food insecurity:    Worry:  Not on file    Inability: Not on file  . Transportation needs:    Medical: Not on file    Non-medical: Not on file  Tobacco Use  . Smoking status: Former Smoker    Last attempt to quit: 09/08/1994    Years since quitting: 23.3  . Smokeless tobacco: Never Used  Substance and Sexual Activity  . Alcohol use: Yes    Alcohol/week: 0.0 standard drinks    Comment: occ  . Drug use: No  . Sexual activity: Yes  Lifestyle  . Physical activity:    Days per week: Not on file    Minutes per session: Not on file  . Stress: Not on file  Relationships  . Social connections:    Talks on phone: Not on file    Gets together: Not on file    Attends religious service: Not on file    Active member of club or organization: Not on file    Attends meetings of clubs or organizations: Not on file    Relationship status: Not on file  Other Topics Concern  . Not on file  Social History Narrative  . Not on file     Family History:  The patient's family history includes Diabetes in her father and paternal grandfather; Heart disease in her father and mother; Hypertension in her father and mother.   ROS:   Please see the history of present illness.    ROS All other systems reviewed and are negative.   PHYSICAL EXAM:   VS:  BP 118/82   Pulse 72   Ht 5\' 2"  (1.575 Oliver)   Wt 137 lb (62.1 kg)   SpO2 98%   BMI 25.06 kg/Oliver    GEN: Well nourished, well developed, in no acute distress  HEENT: normal  Neck: no JVD, carotid bruits, or masses Cardiac: RRR; no murmurs, rubs, or gallops,no edema. Surgical scar healing well.  Respiratory:  clear to auscultation bilaterally, normal work of breathing GI: soft, nontender, nondistended, + BS MS: no deformity or atrophy  Skin: warm and dry, no rash Neuro:  Alert and Oriented x 3, Strength and sensation are intact Psych: euthymic mood, full affect  Wt Readings from Last 3 Encounters:  12/28/17 137 lb (62.1 kg)  12/23/17 139 lb (63 kg)  12/11/17 139 lb 8 oz  (63.3 kg)      Studies/Labs Reviewed:   EKG:  EKG is not ordered today.  Recent Labs: 12/04/2017: B Natriuretic Peptide 14.3 12/08/2017: Magnesium 2.5 12/11/2017: BUN 12; Creatinine, Ser 0.66; Hemoglobin 10.4; Platelets 209; Potassium 3.2; Sodium 138   Lipid Panel    Component Value Date/Time   CHOL 173 12/04/2017 0607   TRIG 85 12/04/2017 0607   HDL 47 12/04/2017 0607   CHOLHDL 3.7 12/04/2017 0607   VLDL 17 12/04/2017 0607   LDLCALC 109 (H) 12/04/2017 0607    Additional studies/ records that were reviewed today include:    CT FFR  12/05/17 FFR 0.7 mid LAD -> hemodynamically significant proximal LAD stenosis FFR not significant in the LCx or RCA.  Echo 12/05/17 LVEF 60-65%, Grade 1 DD, Mild AI, Trivial MR, Trivial TR.  LHC 12/06/17  Ost RCA lesion is 60% stenosed.  Mid RCA-1 lesion is 50% stenosed.  Mid RCA-2 lesion is 80% stenosed.  Prox Cx to Mid Cx lesion is 80% stenosed.  Mid Cx to Dist Cx lesion is 50% stenosed.  Prox Cx lesion is 30% stenosed.  Ost LAD lesion is 80% stenosed.  Prox LAD lesion is 70% stenosed.  Mid LAD lesion is 30% stenosed.  The left ventricular systolic function is normal.  LV end diastolic pressure is normal.  The left ventricular ejection fraction is greater than 65% by visual estimate.  There is no mitral valve regurgitation.  PFTs 12/06/17 FVC 2.81 (89%) FEV1 2.09 (85%)     ASSESSMENT & PLAN:    1. CAD s/p CABG - No angina. Continue walking regimen. Continue current medical therapy with ASA, Plavix, stain and BB.   2. HLD - 12/04/2017: Cholesterol 173; HDL 47; LDL Cholesterol 109; Triglycerides 85; VLDL 17  - LDL goal less than 70. Increase lipitor to 40mg  daily. Recheck labs in 4 weeks.   3. HTN - BP stable on BB and ACE.     Medication Adjustments/Labs and Tests Ordered: Current medicines are reviewed at length with the patient today.  Concerns regarding medicines are outlined above.  Medication  changes, Labs and Tests ordered today are listed in the Patient Instructions below. There are no Patient Instructions on file for this visit.   Jarrett Soho, [place]  12/28/2017 9:29 AM    [facility] [place], [place], [place]  [address] Phone: [phone]; Fax: [phone]

## 2017-12-28 NOTE — Patient Instructions (Signed)
Medication Instructions:  1. INCREASE LIPITOR TO 40 MG DAILY; NEW RX HAS BEEN SENT IN  If you need a refill on your cardiac medications before your next appointment, please call your pharmacy.   2. AN RX FOR NITROGLYCERIN HAS BEEN SENT IN IN AND YOU HAVE BEEN ADVISED AS TO HOW AND WHEN TO USE NTG Lab work: FASTING LIPID AND LIVER PANEL TO BE DONE IN 4 WEEKS ; NOTHING TO EAT AFTER MIDNIGHT THE NIGHT BEFORE LAB WORK; MORNING MEDS WITH WATER IS FINE If you have labs (blood work) drawn today and your tests are completely normal, you will receive your results only by: Marland Kitchen MyChart Message (if you have MyChart) OR . A paper copy in the mail If you have any lab test that is abnormal or we need to change your treatment, we will call you to review the results.  Testing/Procedures: NONE ORDERED TODAY  Follow-Up: At [facility], you and your health needs are our priority.  As part of our continuing mission to provide you with exceptional heart care, we have created designated Provider Care Teams.  These Care Teams include your primary Cardiologist (physician) and Advanced Practice Providers (APPs -  Physician Assistants and Nurse Practitioners) who all work together to provide you with the care you need, when you need it. You will need a follow up appointment in:  3-4 months.  Please call our office 2 months in advance to schedule this appointment.  You may see DR. Burt Knack or one of the following Advanced Practice Providers on your designated Care Team: Richardson Dopp, PA-C [place], [place] . Daune Perch, NP  Any Other Special Instructions Will Be Listed Below (If Applicable).

## 2017-12-29 NOTE — Telephone Encounter (Signed)
Attempted to call patient in regards to Cardiac Rehab - LM on VM 

## 2018-01-04 ENCOUNTER — Other Ambulatory Visit: Payer: Self-pay | Admitting: Cardiothoracic Surgery

## 2018-01-04 DIAGNOSIS — Z951 Presence of aortocoronary bypass graft: Secondary | ICD-10-CM

## 2018-01-05 ENCOUNTER — Ambulatory Visit
Admission: RE | Admit: 2018-01-05 | Discharge: 2018-01-05 | Disposition: A | Discharge status: Home / Self Care | Payer: Commercial Managed Care - PPO | Source: Ambulatory Visit | Attending: Cardiothoracic Surgery | Admitting: Cardiothoracic Surgery

## 2018-01-05 ENCOUNTER — Ambulatory Visit (INDEPENDENT_AMBULATORY_CARE_PROVIDER_SITE_OTHER): Payer: Self-pay | Admitting: Cardiothoracic Surgery

## 2018-01-05 ENCOUNTER — Other Ambulatory Visit: Payer: Self-pay

## 2018-01-05 ENCOUNTER — Encounter: Payer: Self-pay | Admitting: Cardiothoracic Surgery

## 2018-01-05 VITALS — BP 100/64 | HR 62 | Resp 16 | Ht 62.0 in | Wt 139.4 lb

## 2018-01-05 DIAGNOSIS — Z951 Presence of aortocoronary bypass graft: Secondary | ICD-10-CM

## 2018-01-05 DIAGNOSIS — I25110 Atherosclerotic heart disease of native coronary artery with unstable angina pectoris: Secondary | ICD-10-CM

## 2018-01-05 MED ORDER — HYDROXYZINE HCL 10 MG PO TABS: 10.0000 mg | ORAL_TABLET | Freq: Three times a day (TID) | ORAL | 0 refills | Status: DC | PRN

## 2018-01-05 NOTE — Addendum Note (Signed)
Addended by: Floyde Parkins MD, Dorethia Jeanmarie on: 01/05/2018 02:30 PM   Modules accepted: Orders

## 2018-01-05 NOTE — Progress Notes (Signed)
PCP is Vicenta Aly, [place] Referring Provider is Sherren Mocha, MD  Chief Complaint  Patient presents with  . Routine Post Op    s/p CABG X 3...12/02/17 witih a CXR    HPI: 57 year old female doing well after urgent CABG x3 for unstable angina 4 weeks ago.  Patient showing significant improvement with exercise tolerance-walk the treadmill for 20 minutes yesterday.  Appetite and strength improved.  Still with depression now on Cymbalta 60 mg per cardiology.  Surgical incisions are well-healed.  Chest x-ray today clear.  No recurrent angina but still having some chest soreness.  Patient is on 90 days of Plavix for presenting with acute coronary syndrome.   Past Medical History:  Diagnosis Date  . Acid reflux   . Fibroid   . Osteopenia 10/2016   T score -2.1 FRAX's 5.7%/0.3%    Past Surgical History:  Procedure Laterality Date  . ABDOMINAL HYSTERECTOMY  2011   SUPRACERVICAL HYSTERECTOMY  . APPENDECTOMY    . CESAREAN SECTION     X3  . CORONARY ARTERY BYPASS GRAFT N/A 12/07/2017   Procedure: CORONARY ARTERY BYPASS GRAFTING (CABG) TIMES THREE USING LEFT INTERNAL MAMMARY ARTERY AND ENDOSCOPICALLY HARVESTED RIGHT SAPHENOUS VEIN.;  Surgeon: Ivin Poot, MD;  Location: [facility];  Service: Open Heart Surgery;  Laterality: N/A;  . ENDOVEIN HARVEST OF GREATER SAPHENOUS VEIN Right 12/07/2017   Procedure: ENDOVEIN HARVEST OF GREATER SAPHENOUS VEIN;  Surgeon: Ivin Poot, MD;  Location: [facility];  Service: Open Heart Surgery;  Laterality: Right;  . LEFT HEART CATH AND CORONARY ANGIOGRAPHY N/A 12/06/2017   Procedure: LEFT HEART CATH AND CORONARY ANGIOGRAPHY;  Surgeon: Burnell Blanks, MD;  Location: [facility];  Service: Cardiovascular;  Laterality: N/A;  . TEE WITHOUT CARDIOVERSION N/A 12/07/2017   Procedure: TRANSESOPHAGEAL ECHOCARDIOGRAM (TEE);  Surgeon: Prescott Gum, Collier Salina, MD;  Location: [facility];  Service: Open Heart Surgery;  Laterality: N/A;  . TONSILLECTOMY AND  ADENOIDECTOMY      Family History  Problem Relation Age of Onset  . Hypertension Mother   . Heart disease Mother   . Diabetes Father   . Hypertension Father   . Heart disease Father   . Diabetes Paternal Grandfather     Social History Social History   Tobacco Use  . Smoking status: Former Smoker    Last attempt to quit: 09/08/1994    Years since quitting: 23.3  . Smokeless tobacco: Never Used  Substance Use Topics  . Alcohol use: Yes    Alcohol/week: 0.0 standard drinks    Comment: occ  . Drug use: No    Current Outpatient Medications  Medication Sig Dispense Refill  . aspirin 81 MG tablet Take 1 tablet (81 mg total) by mouth daily.    Marland Kitchen atorvastatin (LIPITOR) 40 MG tablet Take 1 tablet (40 mg total) by mouth daily. 90 tablet 3  . clopidogrel (PLAVIX) 75 MG tablet Take 1 tablet (75 mg total) by mouth daily. 30 tablet 1  . DULoxetine (CYMBALTA) 60 MG capsule Take 1 capsule (60 mg total) by mouth daily. 30 capsule 3  . famotidine (PEPCID) 40 MG tablet Take 40 mg by mouth 2 (two) times daily.    Marland Kitchen lisinopril (PRINIVIL,ZESTRIL) 2.5 MG tablet Take 1 tablet (2.5 mg total) by mouth daily. 30 tablet 1  . metoprolol tartrate (LOPRESSOR) 25 MG tablet Take 1 tablet (25 mg total) by mouth 2 (two) times daily. 60 tablet 1  . nitroGLYCERIN (NITROSTAT) 0.4 MG SL tablet Place 1  tablet (0.4 mg total) under the tongue every 5 (five) minutes as needed. 25 tablet 3   No current facility-administered medications for this visit.     Allergies  Allergen Reactions  . Sulfa Antibiotics Swelling    Review of Systems  Appetite and sleeping pattern slightly improved Diffuse pruritus especially on hands and feet without rash, without change in medications. No clicking or popping of sternal incision  BP 100/64 (BP Location: Right Arm, Patient Position: Sitting, Cuff Size: Normal)   Pulse 62   Resp 16   Ht 5\' 2"  (1.575 m)   Wt 139 lb 6.4 oz (63.2 kg)   SpO2 96% Comment: ON RA  BMI 25.50  kg/m  Physical Exam Alert and comfortable Lungs clear Sternal incision well-healed, no sternal instability Heart rhythm regular no gallop or murmur or rub Leg incision well-healed, no pedal edema  Diagnostic Tests: Chest x-ray clear, sternal wires intact  Impression: Excellent early recovery after multivessel CABG. Patient ready to start driving, normal daily activities but still needs to follow sternal restrictions and not lift more than 10-15 pounds  Plan: Patient will start cardiac rehab after holidays, continue current medications and return for follow-up review of progress in 4 weeks.   Len Childs, MD [facility] [phone]

## 2018-01-25 ENCOUNTER — Other Ambulatory Visit: Payer: Commercial Managed Care - PPO

## 2018-01-25 DIAGNOSIS — I2510 Atherosclerotic heart disease of native coronary artery without angina pectoris: Secondary | ICD-10-CM

## 2018-01-25 DIAGNOSIS — E785 Hyperlipidemia, unspecified: Secondary | ICD-10-CM

## 2018-01-26 ENCOUNTER — Telehealth: Payer: Self-pay | Admitting: *Deleted

## 2018-01-26 DIAGNOSIS — Z79899 Other long term (current) drug therapy: Secondary | ICD-10-CM

## 2018-01-26 LAB — LIPID PANEL
CHOLESTEROL TOTAL: 166 mg/dL (ref 100–199)
Chol/HDL Ratio: 3.8 ratio (ref 0.0–4.4)
HDL: 44 mg/dL (ref >39)
LDL CALC: 102 mg/dL — AB (ref 0–99)
Triglycerides: 100 mg/dL (ref 0–149)
VLDL Cholesterol Cal: 20 mg/dL (ref 5–40)

## 2018-01-26 LAB — HEPATIC FUNCTION PANEL
ALBUMIN: 4.6 g/dL (ref 3.5–5.5)
ALT: 16 IU/L (ref 0–32)
AST: 13 IU/L (ref 0–40)
Alkaline Phosphatase: 103 IU/L (ref 39–117)
BILIRUBIN TOTAL: 0.5 mg/dL (ref 0.0–1.2)
Bilirubin, Direct: 0.14 mg/dL (ref 0.00–0.40)
TOTAL PROTEIN: 6.9 g/dL (ref 6.0–8.5)

## 2018-01-26 MED ORDER — ATORVASTATIN CALCIUM 80 MG PO TABS: 80.0000 mg | ORAL_TABLET | Freq: Every day | ORAL | 3 refills | Status: DC

## 2018-01-26 NOTE — Telephone Encounter (Signed)
-----   Message from [place], [place] sent at 01/26/2018 10:47 AM EST ----- LFTS normal. LDL remains elevated at 102. Goal less than 70. Does patient takes her statin? What dose?

## 2018-01-26 NOTE — Telephone Encounter (Signed)
Follow up  ° ° °Patient is returning your call. °

## 2018-01-26 NOTE — Telephone Encounter (Signed)
-----   Message from [place], [place] sent at 01/26/2018  2:29 PM EST ----- Increase Lipitor to 80mg  daily. Avoid cheezy fatty foods. Repeat labs in 6 weeks.

## 2018-01-26 NOTE — Telephone Encounter (Signed)
Returned pt's call.  See result note.  

## 2018-01-26 NOTE — Telephone Encounter (Signed)
Called pt re: lab results, left a message for her to call back  

## 2018-01-27 ENCOUNTER — Other Ambulatory Visit: Payer: Self-pay | Admitting: Physician Assistant

## 2018-01-27 ENCOUNTER — Other Ambulatory Visit: Payer: Self-pay

## 2018-01-27 MED ORDER — METOPROLOL TARTRATE 25 MG PO TABS: 25.0000 mg | ORAL_TABLET | Freq: Two times a day (BID) | ORAL | 9 refills | Status: DC

## 2018-01-27 MED ORDER — CLOPIDOGREL BISULFATE 75 MG PO TABS: 75.0000 mg | ORAL_TABLET | Freq: Every day | ORAL | 9 refills | Status: DC

## 2018-01-27 MED ORDER — LISINOPRIL 2.5 MG PO TABS: 2.5000 mg | ORAL_TABLET | Freq: Every day | ORAL | 10 refills | Status: DC

## 2018-02-04 MED ORDER — LISINOPRIL 2.5 MG PO TABS: 2.5000 mg | ORAL_TABLET | Freq: Every day | ORAL | 2 refills | Status: DC

## 2018-02-04 MED ORDER — METOPROLOL TARTRATE 25 MG PO TABS: 25.0000 mg | ORAL_TABLET | Freq: Two times a day (BID) | ORAL | 2 refills | Status: DC

## 2018-02-04 MED ORDER — CLOPIDOGREL BISULFATE 75 MG PO TABS: 75.0000 mg | ORAL_TABLET | Freq: Every day | ORAL | 2 refills | Status: DC

## 2018-02-16 ENCOUNTER — Other Ambulatory Visit: Payer: Self-pay

## 2018-02-16 ENCOUNTER — Telehealth (HOSPITAL_COMMUNITY): Payer: Self-pay

## 2018-02-16 ENCOUNTER — Encounter: Payer: Self-pay | Admitting: Cardiothoracic Surgery

## 2018-02-16 ENCOUNTER — Ambulatory Visit (INDEPENDENT_AMBULATORY_CARE_PROVIDER_SITE_OTHER): Payer: Self-pay | Admitting: Cardiothoracic Surgery

## 2018-02-16 VITALS — BP 101/72 | HR 60 | Resp 16 | Ht 62.0 in | Wt 143.4 lb

## 2018-02-16 DIAGNOSIS — Z951 Presence of aortocoronary bypass graft: Secondary | ICD-10-CM

## 2018-02-16 DIAGNOSIS — I25110 Atherosclerotic heart disease of native coronary artery with unstable angina pectoris: Secondary | ICD-10-CM

## 2018-02-16 NOTE — Progress Notes (Signed)
PCP is Vicenta Aly, [place] Referring Provider is Sherren Mocha, MD  Chief Complaint  Patient presents with  . Routine Post Op    f/u to check progress s/p CABG X 3 12/07/17    HPI: Final surgical visit 2 months after urgent CABGx3         Patient's strength and energy significantly improved.  Still some chest soreness but getting better and no angina.  Incisions all healing well.  No peripheral edema.  Patient is scheduled for outpatient cardiac rehab dilatation orientation soon.  She will complete a full 3 months of Plavix after surgery for acute coronary syndrome.  No evidence of bleeding.  Last chest x-ray clear with wires intact   Past Medical History:  Diagnosis Date  . Acid reflux   . Fibroid   . Osteopenia 10/2016   T score -2.1 FRAX's 5.7%/0.3%    Past Surgical History:  Procedure Laterality Date  . ABDOMINAL HYSTERECTOMY  2011   SUPRACERVICAL HYSTERECTOMY  . APPENDECTOMY    . CESAREAN SECTION     X3  . CORONARY ARTERY BYPASS GRAFT N/A 12/07/2017   Procedure: CORONARY ARTERY BYPASS GRAFTING (CABG) TIMES THREE USING LEFT INTERNAL MAMMARY ARTERY AND ENDOSCOPICALLY HARVESTED RIGHT SAPHENOUS VEIN.;  Surgeon: Ivin Poot, MD;  Location: [facility];  Service: Open Heart Surgery;  Laterality: N/A;  . ENDOVEIN HARVEST OF GREATER SAPHENOUS VEIN Right 12/07/2017   Procedure: ENDOVEIN HARVEST OF GREATER SAPHENOUS VEIN;  Surgeon: Ivin Poot, MD;  Location: [facility];  Service: Open Heart Surgery;  Laterality: Right;  . LEFT HEART CATH AND CORONARY ANGIOGRAPHY N/A 12/06/2017   Procedure: LEFT HEART CATH AND CORONARY ANGIOGRAPHY;  Surgeon: Burnell Blanks, MD;  Location: [facility];  Service: Cardiovascular;  Laterality: N/A;  . TEE WITHOUT CARDIOVERSION N/A 12/07/2017   Procedure: TRANSESOPHAGEAL ECHOCARDIOGRAM (TEE);  Surgeon: Prescott Gum, Collier Salina, MD;  Location: [facility];  Service: Open Heart Surgery;  Laterality: N/A;  . TONSILLECTOMY AND ADENOIDECTOMY      Family  History  Problem Relation Age of Onset  . Hypertension Mother   . Heart disease Mother   . Diabetes Father   . Hypertension Father   . Heart disease Father   . Diabetes Paternal Grandfather     Social History Social History   Tobacco Use  . Smoking status: Former Smoker    Last attempt to quit: 09/08/1994    Years since quitting: 23.4  . Smokeless tobacco: Never Used  Substance Use Topics  . Alcohol use: Yes    Alcohol/week: 0.0 standard drinks    Comment: occ  . Drug use: No    Current Outpatient Medications  Medication Sig Dispense Refill  . aspirin 81 MG tablet Take 1 tablet (81 mg total) by mouth daily.    Marland Kitchen atorvastatin (LIPITOR) 80 MG tablet Take 1 tablet (80 mg total) by mouth daily. 90 tablet 3  . clopidogrel (PLAVIX) 75 MG tablet Take 1 tablet (75 mg total) by mouth daily. 90 tablet 2  . DULoxetine (CYMBALTA) 60 MG capsule Take 1 capsule (60 mg total) by mouth daily. 30 capsule 3  . famotidine (PEPCID) 40 MG tablet Take 40 mg by mouth 2 (two) times daily.    . hydrOXYzine (ATARAX/VISTARIL) 10 MG tablet Take 1 tablet (10 mg total) by mouth 3 (three) times daily as needed. 30 tablet 0  . lisinopril (PRINIVIL,ZESTRIL) 2.5 MG tablet Take 1 tablet (2.5 mg total) by mouth daily. 90 tablet 2  . metoprolol  tartrate (LOPRESSOR) 25 MG tablet Take 1 tablet (25 mg total) by mouth 2 (two) times daily. 180 tablet 2  . nitroGLYCERIN (NITROSTAT) 0.4 MG SL tablet Place 1 tablet (0.4 mg total) under the tongue every 5 (five) minutes as needed. 25 tablet 3   No current facility-administered medications for this visit.     Allergies  Allergen Reactions  . Sulfa Antibiotics Swelling    Review of Systems  Improved appetite strength and exercise tolerance No difficulty swallowing  BP 101/72 (BP Location: Right Arm, Patient Position: Sitting, Cuff Size: Large)   Pulse 60   Resp 16   Ht 5\' 2"  (1.575 m)   Wt 143 lb 6.4 oz (65 kg)   SpO2 94% Comment: ON RA  BMI 26.23 kg/m   Physical Exam      Exam    General- alert and comfortable    Neck- no JVD, no cervical adenopathy palpable, no carotid bruit   Lungs- clear without rales, wheezes.  Chest incision well-healed and stable.   Cor- regular rate and rhythm, no murmur , gallop   Abdomen- soft, non-tender   Extremities - warm, non-tender, minimal edema   Neuro- oriented, appropriate, no focal weakness   Diagnostic Tests:  None  Impression: Excellent recovery 2 months after urgent CABG x3. The patient is ready to start outpatient rehab. The patient understands she should not lift more than 20 pounds until February 1.  She will stop her Plavix after March 1.  Plan: Return as needed.  Importance and components of of heart healthy diet and heart healthy lifestyle emphasized with patient.   Len Childs, MD [facility] [phone]

## 2018-02-16 NOTE — Telephone Encounter (Signed)
Cardiac Rehab Medication Review by a Pharmacist  Does the patient feel that his/her medications are working for him/her? yes  Has the patient been experiencing any side effects to the medications prescribed?  no  Does the patient measure his/her own blood pressure or blood glucose at home?  yes - 105/73  Does the patient have any problems obtaining medications due to transportation or finances?   no  Understanding of regimen: good Understanding of indications: good Potential of compliance: good  Pharmacist comments: none  Vertis Kelch, PharmD PGY1 Pharmacy Resident Phone [phone] 02/16/2018       1:15 PM

## 2018-02-21 ENCOUNTER — Telehealth (HOSPITAL_COMMUNITY): Payer: Self-pay

## 2018-02-21 NOTE — Telephone Encounter (Signed)
Pt called with updated insurance information:  Pt insurance is active and benefits verified through [place]. Co-pay $0.00, DED $3,000.00/$0.00 met, out of pocket $6,650.00/$0.00 met, co-insurance 20%. No pre-authorization required.Jenny/[employer], 02/21/2018 @ 3:15PM, REF# [id number]

## 2018-02-21 NOTE — Telephone Encounter (Signed)
Called to verify pt's 2020 insurance benefits, talk with Lonn Georgia at [facility] who stated pt benefits ended 02/08/18. PJA#[id number]  Attempted to contact pt to see if she had any other insurance, LMTCB.

## 2018-02-21 NOTE — Progress Notes (Signed)
[name] 58 y.o. female DOB 1960/10/29 MRN [id number]       Nutrition  No diagnosis found. Past Medical History:  Diagnosis Date  . Acid reflux   . Fibroid   . Osteopenia 10/2016   T score -2.1 FRAX's 5.7%/0.3%   Meds reviewed.     Current Outpatient Medications (Cardiovascular):  .  atorvastatin (LIPITOR) 80 MG tablet, Take 1 tablet (80 mg total) by mouth daily. Marland Kitchen  lisinopril (PRINIVIL,ZESTRIL) 2.5 MG tablet, Take 1 tablet (2.5 mg total) by mouth daily. .  metoprolol tartrate (LOPRESSOR) 25 MG tablet, Take 1 tablet (25 mg total) by mouth 2 (two) times daily. .  nitroGLYCERIN (NITROSTAT) 0.4 MG SL tablet, Place 1 tablet (0.4 mg total) under the tongue every 5 (five) minutes as needed. (Patient not taking: Reported on 02/16/2018)   Current Outpatient Medications (Analgesics):  .  aspirin 81 MG tablet, Take 1 tablet (81 mg total) by mouth daily.  Current Outpatient Medications (Hematological):  .  clopidogrel (PLAVIX) 75 MG tablet, Take 1 tablet (75 mg total) by mouth daily.  Current Outpatient Medications (Other):  .  calcium carbonate (OS-CAL - DOSED IN MG OF ELEMENTAL CALCIUM) 1250 (500 Ca) MG tablet, Take 1 tablet by mouth daily with breakfast. .  DULoxetine (CYMBALTA) 60 MG capsule, Take 1 capsule (60 mg total) by mouth daily. .  famotidine (PEPCID) 40 MG tablet, Take 40 mg by mouth at bedtime.  .  hydrOXYzine (ATARAX/VISTARIL) 10 MG tablet, Take 1 tablet (10 mg total) by mouth 3 (three) times daily as needed. (Patient not taking: Reported on 02/16/2018) .  Multiple Vitamin (MULTIVITAMIN) tablet, Take 1 tablet by mouth daily.   HT: Ht Readings from Last 1 Encounters:  02/16/18 5\' 2"  (1.575 m)    WT: Wt Readings from Last 5 Encounters:  02/16/18 143 lb 6.4 oz (65 kg)  01/05/18 139 lb 6.4 oz (63.2 kg)  12/28/17 137 lb (62.1 kg)  12/23/17 139 lb (63 kg)  12/11/17 139 lb 8 oz (63.3 kg)     There is no height or weight on file to calculate BMI =  26.2      02/16/18  Current tobacco use? No       Labs:  Lipid Panel     Component Value Date/Time   CHOL 166 01/25/2018 1247   TRIG 100 01/25/2018 1247   HDL 44 01/25/2018 1247   CHOLHDL 3.8 01/25/2018 1247   CHOLHDL 3.7 12/04/2017 0607   VLDL 17 12/04/2017 0607   LDLCALC 102 (H) 01/25/2018 1247    Lab Results  Component Value Date   HGBA1C 5.6 12/06/2017   CBG (last 3)  No results for input(s): GLUCAP in the last 72 hours.  Nutrition Diagnosis ? Food-and nutrition-related knowledge deficit related to lack of exposure to information as related to diagnosis of: ? CVD   Nutrition Goal(s):  ? To be determined  Plan:  Pt to attend nutrition classes ? Nutrition I ? Nutrition II ? Portion Distortion  Will provide client-centered nutrition education as part of interdisciplinary care.   Monitor and evaluate progress toward nutrition goal with team.  [name], MS, RD, LDN 02/21/2018 12:17 PM

## 2018-02-24 ENCOUNTER — Encounter (HOSPITAL_COMMUNITY): Payer: Self-pay

## 2018-02-24 ENCOUNTER — Encounter (HOSPITAL_COMMUNITY)
Admission: RE | Admit: 2018-02-24 | Discharge: 2018-02-24 | Disposition: A | Discharge status: Home / Self Care | Payer: Managed Care, Other (non HMO) | Source: Ambulatory Visit | Attending: Internal Medicine | Admitting: Internal Medicine

## 2018-02-24 VITALS — BP 122/84 | HR 58 | Ht 62.25 in | Wt 141.1 lb

## 2018-02-24 DIAGNOSIS — Z951 Presence of aortocoronary bypass graft: Secondary | ICD-10-CM | POA: Insufficient documentation

## 2018-02-24 HISTORY — DX: Essential (primary) hypertension: I10

## 2018-02-24 HISTORY — DX: Atherosclerotic heart disease of native coronary artery without angina pectoris: I25.10

## 2018-02-24 HISTORY — DX: Hyperlipidemia, unspecified: E78.5

## 2018-02-24 NOTE — Progress Notes (Signed)
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  aspirin 81 MG tablet, Take 1 tablet (81 mg total) by mouth daily., Disp: , Rfl:  .  atorvastatin (LIPITOR) 80 MG tablet, Take 1 tablet (80 mg total) by mouth daily., Disp: 90 tablet, Rfl: 3 .  calcium carbonate (OS-CAL - DOSED IN MG OF ELEMENTAL CALCIUM) 1250 (500 Ca) MG tablet, Take 1 tablet by mouth daily with breakfast., Disp: , Rfl:  .  clopidogrel (PLAVIX) 75 MG tablet, Take 1 tablet (75 mg total) by mouth daily., Disp: 90 tablet, Rfl: 2 .  DULoxetine (CYMBALTA) 60 MG capsule, Take 1 capsule (60 mg total) by mouth daily., Disp: 30 capsule, Rfl: 3 .  famotidine (PEPCID) 40 MG tablet, Take 40 mg by mouth at bedtime. , Disp: , Rfl:  .  hydrOXYzine (ATARAX/VISTARIL) 10 MG tablet, Take 1 tablet (10 mg total) by mouth 3 (three) times daily as needed. (Patient not taking: Reported on 02/16/2018), Disp: 30 tablet, Rfl: 0 .  lisinopril (PRINIVIL,ZESTRIL) 2.5 MG tablet, Take 1 tablet (2.5 mg total) by mouth daily., Disp: 90 tablet, Rfl: 2 .  metoprolol tartrate (LOPRESSOR) 25 MG tablet, Take 1 tablet (25 mg total) by mouth 2 (two) times daily., Disp: 180 tablet, Rfl: 2 .  Multiple Vitamin (MULTIVITAMIN) tablet, Take 1 tablet by mouth daily., Disp: , Rfl:  .  nitroGLYCERIN (NITROSTAT) 0.4 MG SL tablet, Place 1 tablet (0.4 mg total) under the tongue every 5 (five) minutes as needed. (Patient not taking: Reported on 02/16/2018), Disp: 25 tablet, Rfl:  3  Past Medical History: Past Medical History:  Diagnosis Date  . Acid reflux   . Coronary artery disease   . Fibroid   . Hyperlipidemia   . Hypertension   . Osteopenia 10/2016   T score -2.1 FRAX's 5.7%/0.3%    Tobacco Use: Social History   Tobacco Use  Smoking Status Former Smoker  . Last attempt to quit: 09/08/1994  . Years since quitting: 23.4  Smokeless Tobacco Never Used    Labs: Recent Review Flowsheet Data    Labs for ITP Cardiac and Pulmonary Rehab Latest Ref Rng & Units 12/07/2017 12/08/2017 12/08/2017 12/08/2017 01/25/2018   Cholestrol 100 - 199 mg/dL - - - - 166   LDLCALC 0 - 99 mg/dL - - - - 102(H)   HDL >39 mg/dL - - - - 44   Trlycerides 0 - 149 mg/dL - - - - 100   Hemoglobin A1c 4.8 - 5.6 % - - - - -   PHART 7.350 - 7.450 7.269(L) 7.287(L) 7.339(L) - -   PCO2ART 32.0 - 48.0 mmHg 46.3 46.8 42.3 - -   HCO3 20.0 - 28.0 mmol/L 21.4 22.5 22.9 - -   TCO2 22 - 32 mmol/L 23 24 24 24  -   ACIDBASEDEF 0.0 - 2.0 mmol/L 6.0(H) 4.0(H) 3.0(H) - -   O2SAT % 92.0 95.0 93.0 - -      Capillary Blood Glucose: Lab Results  Component Value Date   GLUCAP 138 (H) 12/08/2017   GLUCAP 106 (H) 12/08/2017   GLUCAP 98 12/08/2017   GLUCAP 135 (H) 12/08/2017   GLUCAP 149 (H) 12/07/2017     Exercise Target Goals: Exercise Program Goal: Individual exercise prescription set using results from initial 6 min walk test and THRR while considering  patient's activity barriers and safety.   Exercise Prescription Goal: Initial exercise prescription builds to 30-45 minutes a day of aerobic activity, 2-3 days per week.  Home exercise guidelines will be given to patient during program as part of exercise prescription that the participant will acknowledge.  Activity Barriers & Risk Stratification: Activity Barriers & Cardiac Risk Stratification - 02/24/18 1115      Activity Barriers & Cardiac Risk Stratification   Activity Barriers  None    Cardiac Risk Stratification  High        6 Minute Walk: 6 Minute Walk    Row Name 02/24/18 1110         6 Minute Walk   Phase  Initial     Distance  1846 feet     Walk Time  6 minutes     # of Rest Breaks  0     MPH  3.5     METS  4.23     RPE  11     Perceived Dyspnea   0     VO2 Peak  14.8     Symptoms  No     Resting HR  58 bpm     Resting BP  122/84     Resting Oxygen Saturation   97 %     Exercise Oxygen Saturation  during 6 min walk  98 %     Max Ex. HR  68 bpm     Max Ex. BP  126/82     2 Minute Post BP  112/68        Oxygen Initial Assessment:   Oxygen Re-Evaluation:   Oxygen Discharge (Final Oxygen Re-Evaluation):   Initial Exercise Prescription: Initial Exercise Prescription - 02/24/18 1100      Date of Initial Exercise RX and Referring Provider   Date  02/24/18    Referring Provider  Glori Bickers MD    Expected Discharge Date  06/03/18      Treadmill   MPH  2.3    Grade  1    Minutes  10    METs  3.08      Recumbant Bike   Level  2.5    Watts  40    Minutes  10    METs  3.94      NuStep   Level  3    SPM  75    Minutes  10    METs  3.5      Prescription Details   Frequency (times per week)  3x    Duration  Progress to 30 minutes of continuous aerobic without signs/symptoms of physical distress      Intensity   THRR 40-80% of Max Heartrate  65-130    Ratings of Perceived Exertion  11-13    Perceived Dyspnea  0-4      Progression   Progression  Continue progressive overload as per policy without signs/symptoms or physical distress.      Resistance Training   Training Prescription  Yes    Weight  4lbs    Reps  10-15       Perform Capillary Blood Glucose  checks as needed.  Exercise Prescription Changes:   Exercise Comments:   Exercise Goals and Review: Exercise Goals    Row Name 02/24/18 1115             Exercise Goals   Increase Strength and Stamina  Yes       Intervention  Develop an individualized exercise prescription for aerobic and  resistive training based on initial evaluation findings, risk stratification, comorbidities and participant's personal goals.;Provide advice, education, support and counseling about physical activity/exercise needs.       Expected Outcomes  Short Term: Increase workloads from initial exercise prescription for resistance, speed, and METs.;Short Term: Perform resistance training exercises routinely during rehab and add in resistance training at home;Long Term: Improve cardiorespiratory fitness, muscular endurance and strength as measured by increased METs and functional capacity (6MWT)       Able to understand and use rate of perceived exertion (RPE) scale  Yes       Intervention  Provide education and explanation on how to use RPE scale       Expected Outcomes  Short Term: Able to use RPE daily in rehab to express subjective intensity level;Long Term:  Able to use RPE to guide intensity level when exercising independently       Knowledge and understanding of Target Heart Rate Range (THRR)  Yes       Intervention  Provide education and explanation of THRR including how the numbers were predicted and where they are located for reference       Expected Outcomes  Long Term: Able to use THRR to govern intensity when exercising independently;Short Term: Able to state/look up THRR;Short Term: Able to use daily as guideline for intensity in rehab       Able to check pulse independently  Yes       Intervention  Review the importance of being able to check your own pulse for safety during independent exercise;Provide education and demonstration on how to check pulse in carotid and radial arteries.       Expected Outcomes  Long Term: Able to check pulse independently and accurately;Short Term: Able to explain why pulse checking is important during independent exercise       Understanding of Exercise Prescription  Yes       Intervention  Provide education, explanation, and written materials on patient's individual  exercise prescription       Expected Outcomes  Long Term: Able to explain home exercise prescription to exercise independently;Short Term: Able to explain program exercise prescription          Exercise Goals Re-Evaluation :   Discharge Exercise Prescription (Final Exercise Prescription Changes):   Nutrition:  Target Goals: Understanding of nutrition guidelines, daily intake of sodium 1500mg , cholesterol 200mg , calories 30% from fat and 7% or less from saturated fats, daily to have 5 or more servings of fruits and vegetables.  Biometrics: Pre Biometrics - 02/24/18 1113      Pre Biometrics   Height  5' 2.25" (1.581 m)    Weight  64 kg    Waist Circumference  32.5 inches    Hip Circumference  39.5 inches    Waist to Hip Ratio  0.82 %    BMI (Calculated)  25.6    Triceps Skinfold  31 mm    % Body Fat  39.7 %    Grip Strength  27.5 kg    Flexibility  12 in    Single Leg Stand  5 seconds  Nutrition Therapy Plan and Nutrition Goals:   Nutrition Assessments:   Nutrition Goals Re-Evaluation:   Nutrition Goals Re-Evaluation:   Nutrition Goals Discharge (Final Nutrition Goals Re-Evaluation):   Psychosocial: Target Goals: Acknowledge presence or absence of significant depression and/or stress, maximize coping skills, provide positive support system. Participant is able to verbalize types and ability to use techniques and skills needed for reducing stress and depression.  Initial Review & Psychosocial Screening: Initial Psych Review & Screening - 02/24/18 1057      Initial Review   Current issues with  Current Depression      Family Dynamics   Good Support System?  Yes   Pt lists her husband, mother, and daughters as soures of support.      Barriers   Psychosocial barriers to participate in program  The patient should benefit from training in stress management and relaxation.      Screening Interventions   Interventions  Encouraged to exercise;Other  (comment)   Pt recently started on medications for depression and reports that this has helped.        Quality of Life Scores: Quality of Life - 02/24/18 1054      Quality of Life   Select  Quality of Life      Quality of Life Scores   Health/Function Pre  25.53 %    Socioeconomic Pre  20.06 %    Psych/Spiritual Pre  24.92 %    Family Pre  30 %    GLOBAL Pre  24.79 %      Scores of 19 and below usually indicate a poorer quality of life in these areas.  A difference of  2-3 points is a clinically meaningful difference.  A difference of 2-3 points in the total score of the Quality of Life Index has been associated with significant improvement in overall quality of life, self-image, physical symptoms, and general health in studies assessing change in quality of life.  PHQ-9: Recent Review Flowsheet Data    There is no flowsheet data to display.     Interpretation of Total Score  Total Score Depression Severity:  1-4 = Minimal depression, 5-9 = Mild depression, 10-14 = Moderate depression, 15-19 = Moderately severe depression, 20-27 = Severe depression   Psychosocial Evaluation and Intervention:   Psychosocial Re-Evaluation:   Psychosocial Discharge (Final Psychosocial Re-Evaluation):   Vocational Rehabilitation: Provide vocational rehab assistance to qualifying candidates.   Vocational Rehab Evaluation & Intervention: Vocational Rehab - 02/24/18 1053      Initial Vocational Rehab Evaluation & Intervention   Assessment shows need for Vocational Rehabilitation  No       Education: Education Goals: Education classes will be provided on a weekly basis, covering required topics. Participant will state understanding/return demonstration of topics presented.  Learning Barriers/Preferences: Learning Barriers/Preferences - 02/24/18 1129      Learning Barriers/Preferences   Learning Barriers  Sight    Learning Preferences  Written Material       Education  Topics: Count Your Pulse:  -Group instruction provided by verbal instruction, demonstration, patient participation and written materials to support subject.  Instructors address importance of being able to find your pulse and how to count your pulse when at home without a heart monitor.  Patients get hands on experience counting their pulse with staff help and individually.   Heart Attack, Angina, and Risk Factor Modification:  -Group instruction provided by verbal instruction, video, and written materials to support subject.  Instructors address signs and symptoms  of angina and heart attacks.    Also discuss risk factors for heart disease and how to make changes to improve heart health risk factors.   Functional Fitness:  -Group instruction provided by verbal instruction, demonstration, patient participation, and written materials to support subject.  Instructors address safety measures for doing things around the house.  Discuss how to get up and down off the floor, how to pick things up properly, how to safely get out of a chair without assistance, and balance training.   Meditation and Mindfulness:  -Group instruction provided by verbal instruction, patient participation, and written materials to support subject.  Instructor addresses importance of mindfulness and meditation practice to help reduce stress and improve awareness.  Instructor also leads participants through a meditation exercise.    Stretching for Flexibility and Mobility:  -Group instruction provided by verbal instruction, patient participation, and written materials to support subject.  Instructors lead participants through series of stretches that are designed to increase flexibility thus improving mobility.  These stretches are additional exercise for major muscle groups that are typically performed during regular warm up and cool down.   Hands Only CPR:  -Group verbal, video, and participation provides a basic overview of  AHA guidelines for community CPR. Role-play of emergencies allow participants the opportunity to practice calling for help and chest compression technique with discussion of AED use.   Hypertension: -Group verbal and written instruction that provides a basic overview of hypertension including the most recent diagnostic guidelines, risk factor reduction with self-care instructions and medication management.    Nutrition I class: Heart Healthy Eating:  -Group instruction provided by PowerPoint slides, verbal discussion, and written materials to support subject matter. The instructor gives an explanation and review of the Therapeutic Lifestyle Changes diet recommendations, which includes a discussion on lipid goals, dietary fat, sodium, fiber, plant stanol/sterol esters, sugar, and the components of a well-balanced, healthy diet.   Nutrition II class: Lifestyle Skills:  -Group instruction provided by PowerPoint slides, verbal discussion, and written materials to support subject matter. The instructor gives an explanation and review of label reading, grocery shopping for heart health, heart healthy recipe modifications, and ways to make healthier choices when eating out.   Diabetes Question & Answer:  -Group instruction provided by PowerPoint slides, verbal discussion, and written materials to support subject matter. The instructor gives an explanation and review of diabetes co-morbidities, pre- and post-prandial blood glucose goals, pre-exercise blood glucose goals, signs, symptoms, and treatment of hypoglycemia and hyperglycemia, and foot care basics.   Diabetes Blitz:  -Group instruction provided by PowerPoint slides, verbal discussion, and written materials to support subject matter. The instructor gives an explanation and review of the physiology behind type 1 and type 2 diabetes, diabetes medications and rational behind using different medications, pre- and post-prandial blood glucose  recommendations and Hemoglobin A1c goals, diabetes diet, and exercise including blood glucose guidelines for exercising safely.    Portion Distortion:  -Group instruction provided by PowerPoint slides, verbal discussion, written materials, and food models to support subject matter. The instructor gives an explanation of serving size versus portion size, changes in portions sizes over the last 20 years, and what consists of a serving from each food group.   Stress Management:  -Group instruction provided by verbal instruction, video, and written materials to support subject matter.  Instructors review role of stress in heart disease and how to cope with stress positively.     Exercising on Your Own:  -Group instruction provided  by verbal instruction, power point, and written materials to support subject.  Instructors discuss benefits of exercise, components of exercise, frequency and intensity of exercise, and end points for exercise.  Also discuss use of nitroglycerin and activating EMS.  Review options of places to exercise outside of rehab.  Review guidelines for sex with heart disease.   Cardiac Drugs I:  -Group instruction provided by verbal instruction and written materials to support subject.  Instructor reviews cardiac drug classes: antiplatelets, anticoagulants, beta blockers, and statins.  Instructor discusses reasons, side effects, and lifestyle considerations for each drug class.   Cardiac Drugs II:  -Group instruction provided by verbal instruction and written materials to support subject.  Instructor reviews cardiac drug classes: angiotensin converting enzyme inhibitors (ACE-I), angiotensin II receptor blockers (ARBs), nitrates, and calcium channel blockers.  Instructor discusses reasons, side effects, and lifestyle considerations for each drug class.   Anatomy and Physiology of the Circulatory System:  Group verbal and written instruction and models provide basic cardiac anatomy  and physiology, with the coronary electrical and arterial systems. Review of: AMI, Angina, Valve disease, Heart Failure, Peripheral Artery Disease, Cardiac Arrhythmia, Pacemakers, and the ICD.   Other Education:  -Group or individual verbal, written, or video instructions that support the educational goals of the cardiac rehab program.   Holiday Eating Survival Tips:  -Group instruction provided by PowerPoint slides, verbal discussion, and written materials to support subject matter. The instructor gives patients tips, tricks, and techniques to help them not only survive but enjoy the holidays despite the onslaught of food that accompanies the holidays.   Knowledge Questionnaire Score: Knowledge Questionnaire Score - 02/24/18 1053      Knowledge Questionnaire Score   Pre Score  21/24       Core Components/Risk Factors/Patient Goals at Admission: Personal Goals and Risk Factors at Admission - 02/24/18 1129      Core Components/Risk Factors/Patient Goals on Admission    Weight Management  Yes    Intervention  Weight Management: Develop a combined nutrition and exercise program designed to reach desired caloric intake, while maintaining appropriate intake of nutrient and fiber, sodium and fats, and appropriate energy expenditure required for the weight goal.;Weight Management: Provide education and appropriate resources to help participant work on and attain dietary goals.;Weight Management/Obesity: Establish reasonable short term and long term weight goals.    Admit Weight  141 lb 1.5 oz (64 kg)    Goal Weight: Long Term  135 lb (61.2 kg)    Expected Outcomes  Short Term: Continue to assess and modify interventions until short term weight is achieved;Long Term: Adherence to nutrition and physical activity/exercise program aimed toward attainment of established weight goal;Weight Loss: Understanding of general recommendations for a balanced deficit meal plan, which promotes 1-2 lb weight loss  per week and includes a negative energy balance of [phone] kcal/d;Weight Maintenance: Understanding of the daily nutrition guidelines, which includes 25-35% calories from fat, 7% or less cal from saturated fats, less than 200mg  cholesterol, less than 1.5gm of sodium, & 5 or more servings of fruits and vegetables daily;Understanding recommendations for meals to include 15-35% energy as protein, 25-35% energy from fat, 35-60% energy from carbohydrates, less than 200mg  of dietary cholesterol, 20-35 gm of total fiber daily;Understanding of distribution of calorie intake throughout the day with the consumption of 4-5 meals/snacks    Hypertension  Yes    Intervention  Provide education on lifestyle modifcations including regular physical activity/exercise, weight management, moderate sodium restriction and increased consumption  of fresh fruit, vegetables, and low fat dairy, alcohol moderation, and smoking cessation.;Monitor prescription use compliance.    Expected Outcomes  Short Term: Continued assessment and intervention until BP is < 140/61mm HG in hypertensive participants. < 130/46mm HG in hypertensive participants with diabetes, heart failure or chronic kidney disease.;Long Term: Maintenance of blood pressure at goal levels.    Lipids  Yes    Intervention  Provide education and support for participant on nutrition & aerobic/resistive exercise along with prescribed medications to achieve LDL 70mg , HDL >40mg .    Expected Outcomes  Short Term: Participant states understanding of desired cholesterol values and is compliant with medications prescribed. Participant is following exercise prescription and nutrition guidelines.;Long Term: Cholesterol controlled with medications as prescribed, with individualized exercise RX and with personalized nutrition plan. Value goals: LDL < 70mg , HDL > 40 mg.       Core Components/Risk Factors/Patient Goals Review:    Core Components/Risk Factors/Patient Goals at  Discharge (Final Review):    ITP Comments: ITP Comments    Row Name 02/24/18 1110           ITP Comments  Dr. Fransico Him, Medical Director           Comments: Fraser Din attended orientation from [phone] to 1005 to review rules and guidelines for program. Completed 6 minute walk test, Intitial ITP, and exercise prescription.  VSS. Telemetry-Sinus brady, Sinus Rhythm.  Asymptomatic.Barnet Pall, RN,BSN 02/24/2018 12:20 PM

## 2018-02-28 ENCOUNTER — Ambulatory Visit (INDEPENDENT_AMBULATORY_CARE_PROVIDER_SITE_OTHER): Payer: Managed Care, Other (non HMO) | Admitting: Obstetrics & Gynecology

## 2018-02-28 ENCOUNTER — Encounter: Payer: Self-pay | Admitting: Obstetrics & Gynecology

## 2018-02-28 ENCOUNTER — Other Ambulatory Visit (HOSPITAL_COMMUNITY): Payer: Self-pay | Admitting: Internal Medicine

## 2018-02-28 VITALS — BP 144/92 | Ht 61.0 in | Wt 140.0 lb

## 2018-02-28 DIAGNOSIS — Z90711 Acquired absence of uterus with remaining cervical stump: Secondary | ICD-10-CM | POA: Diagnosis not present

## 2018-02-28 DIAGNOSIS — Z780 Asymptomatic menopausal state: Secondary | ICD-10-CM | POA: Diagnosis not present

## 2018-02-28 DIAGNOSIS — Z01419 Encounter for gynecological examination (general) (routine) without abnormal findings: Secondary | ICD-10-CM | POA: Diagnosis not present

## 2018-02-28 DIAGNOSIS — M8589 Other specified disorders of bone density and structure, multiple sites: Secondary | ICD-10-CM | POA: Diagnosis not present

## 2018-02-28 NOTE — Progress Notes (Signed)
[name] 09/22/1960 [id number]   History:    58 y.o. [id number] Married  RP:  Established patient presenting for annual gyn exam   HPI: Menopause, well on no HRT with still mild hot flushes occasionally.  S/P Supracervical Hysterectomy/Bilateral Salpingectomy for Fibroids.  No pelvic pain.  No pain with IC, using [place].  Breasts normal.  BMI 26.45.  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  No Cx.  Will start Cardiac Rehab this week.  Healthy nutrition.    Past medical history,surgical history, family history and social history were all reviewed and documented in the EPIC chart.  Gynecologic History No LMP recorded. Patient has had a hysterectomy. Contraception: status post hysterectomy (Supracervical) Last Pap: 09/2013. Results were: Negatvie Last mammogram: 02/2017. Results were: Negative.   Bone Density: 10/2016 Osteopenia Colonoscopy: 2017  Obstetric History OB History  Gravida Para Term Preterm AB Living  4 3     1 3   SAB TAB Ectopic Multiple Live Births  1            # Outcome Date GA Lbr Len/2nd Weight Sex Delivery Anes PTL Lv  4 SAB           3 Para           2 Para           1 Para              ROS: A ROS was performed and pertinent positives and negatives are included in the history.  GENERAL: No fevers or chills. HEENT: No change in vision, no earache, sore throat or sinus congestion. NECK: No pain or stiffness. CARDIOVASCULAR: No chest pain or pressure. No palpitations. PULMONARY: No shortness of breath, cough or wheeze. GASTROINTESTINAL: No abdominal pain, nausea, vomiting or diarrhea, melena or bright red blood per rectum. GENITOURINARY: No urinary frequency, urgency, hesitancy or dysuria. MUSCULOSKELETAL: No joint or muscle pain, no back pain, no recent trauma. DERMATOLOGIC: No rash, no itching, no lesions. ENDOCRINE: No polyuria, polydipsia, no heat or cold intolerance. No recent change in weight. HEMATOLOGICAL: No anemia or easy bruising or bleeding.  NEUROLOGIC: No headache, seizures, numbness, tingling or weakness. PSYCHIATRIC: No depression, no loss of interest in normal activity or change in sleep pattern.     Exam:   BP (!) 144/92   Ht 5\' 1"  (1.549 m)   Wt 140 lb (63.5 kg)   BMI 26.45 kg/m   Body mass index is 26.45 kg/m.  General appearance : Well developed well nourished female. No acute distress HEENT: Eyes: no retinal hemorrhage or exudates,  Neck supple, trachea midline, no carotid bruits, no thyroidmegaly Lungs: Clear to auscultation, no rhonchi or wheezes, or rib retractions  Heart: Regular rate and rhythm, no murmurs or gallops Breast:Examined in sitting and supine position were symmetrical in appearance, no palpable masses or tenderness,  no skin retraction, no nipple inversion, no nipple discharge, no skin discoloration, no axillary or supraclavicular lymphadenopathy Abdomen: no palpable masses or tenderness, no rebound or guarding Extremities: no edema or skin discoloration or tenderness  Pelvic: Vulva: Normal             Vagina: No gross lesions or discharge  Cervix: No gross lesions or discharge.  Pap reflex done  Uterus: Absent  Adnexa  Without masses or tenderness  Anus: Normal   Assessment/Plan:  58 y.o. female for annual exam   1. Encounter for routine gynecological examination with Papanicolaou smear of cervix Gynecologic exam  status post supracervical hysterectomy.  Pap reflex done.  Breast exam normal.  Patient will schedule her screening mammogram when completely healed from thoracic surgery for coronary artery bypass x3.  Health labs with family physician.  2. Postmenopausal Well on no hormone replacement therapy.  Mild hot flashes persist.  Using KY for intercourse.  Coconut oil suggested.  3. History of hysterectomy, supracervical  4. Osteopenia of multiple sites Osteopenia on last bone density September 2018.  Will repeat a bone density September 2020.  Calcium intake of 1.5 g/day with  vitamin D supplements and regular weightbearing physical activity recommended. - DG Bone Density; Future  Princess Bruins MD, 4:02 PM 02/28/2018

## 2018-02-28 NOTE — Patient Instructions (Signed)
1. Encounter for routine gynecological examination with Papanicolaou smear of cervix Gynecologic exam status post supracervical hysterectomy.  Pap reflex done.  Breast exam normal.  Patient will schedule her screening mammogram when completely healed from thoracic surgery for coronary artery bypass x3.  Health labs with family physician.  2. Postmenopausal Well on no hormone replacement therapy.  Mild hot flashes persist.  Using KY for intercourse.  Coconut oil suggested.  3. History of hysterectomy, supracervical  4. Osteopenia of multiple sites Osteopenia on last bone density September 2018.  Will repeat a bone density September 2020.  Calcium intake of 1.5 g/day with vitamin D supplements and regular weightbearing physical activity recommended. - DG Bone Density; Future  [name], it was a pleasure seeing you today!  I will inform you of your results as soon as they are available.

## 2018-03-01 LAB — PAP IG W/ RFLX HPV ASCU

## 2018-03-02 ENCOUNTER — Encounter (HOSPITAL_COMMUNITY)
Admission: RE | Admit: 2018-03-02 | Discharge: 2018-03-02 | Disposition: A | Discharge status: Home / Self Care | Payer: Managed Care, Other (non HMO) | Source: Ambulatory Visit | Attending: Internal Medicine | Admitting: Internal Medicine

## 2018-03-02 ENCOUNTER — Ambulatory Visit (HOSPITAL_COMMUNITY): Payer: Commercial Managed Care - PPO

## 2018-03-02 DIAGNOSIS — Z951 Presence of aortocoronary bypass graft: Secondary | ICD-10-CM

## 2018-03-02 NOTE — Progress Notes (Signed)
Pt started cardiac rehab today.  Pt tolerated light exercise without difficulty. VSS, telemetry-sinus Rhythm, asymptomatic.  Medication list reconciled. Pt denies barriers to medicaiton compliance.  PSYCHOSOCIAL ASSESSMENT:  PHQ-0. Pt exhibits positive coping skills, hopeful outlook with supportive family. No psychosocial needs identified at this time, no psychosocial interventions necessary.    Pt enjoys reading traveling and spending time with her grandchildren.   Pt oriented to exercise equipment and routine.    Understanding verbalized.Barnet Pall, RN,BSN 03/02/2018 11:09 AM

## 2018-03-04 ENCOUNTER — Encounter (HOSPITAL_COMMUNITY)
Admission: RE | Admit: 2018-03-04 | Discharge: 2018-03-04 | Disposition: A | Discharge status: Home / Self Care | Payer: Managed Care, Other (non HMO) | Source: Ambulatory Visit | Attending: Internal Medicine | Admitting: Internal Medicine

## 2018-03-04 ENCOUNTER — Ambulatory Visit (HOSPITAL_COMMUNITY): Payer: Commercial Managed Care - PPO

## 2018-03-04 DIAGNOSIS — Z951 Presence of aortocoronary bypass graft: Secondary | ICD-10-CM

## 2018-03-07 ENCOUNTER — Ambulatory Visit (HOSPITAL_COMMUNITY): Payer: Commercial Managed Care - PPO

## 2018-03-07 ENCOUNTER — Encounter (HOSPITAL_COMMUNITY)
Admission: RE | Admit: 2018-03-07 | Discharge: 2018-03-07 | Disposition: A | Discharge status: Home / Self Care | Payer: Managed Care, Other (non HMO) | Source: Ambulatory Visit | Attending: Internal Medicine | Admitting: Internal Medicine

## 2018-03-07 DIAGNOSIS — Z951 Presence of aortocoronary bypass graft: Secondary | ICD-10-CM | POA: Diagnosis not present

## 2018-03-07 NOTE — Progress Notes (Signed)
[name] 58 y.o. female Nutrition Note Spoke with pt. Nutrition Plan and Nutrition Survey goals reviewed with pt. Pt is following a Heart Healthy diet. Pt wants to lose wt, ~5 lbs. Pt has not been trying to lose wt. Wt loss tips reviewed (label reading, how to build a healthy plate, portion sizes, eating frequently across the day, intuitive eating). Pt shared she has a sweet tooth but will overly restrict then subsequently overindulge. Discussed intuitive eating with patient and recommended pt practice asking herself where she falls on a hunger scale before, half way through a meal and after a meal. Discussed additionally having a small planned out dessert daily: greek yogurt, 1 small piece of dark chocolate, 1 serving of fruit, etc. To help manage cravings. Per discussion, pt does not use canned/convenience foods often. Pt does not add salt to food. Pt does not eat out frequently. Pt expressed understanding of the information reviewed. Pt aware of nutrition education classes offered and does plan on attending nutrition classes.  Lab Results  Component Value Date   HGBA1C 5.6 12/06/2017    Wt Readings from Last 3 Encounters:  02/28/18 140 lb (63.5 kg)  02/24/18 141 lb 1.5 oz (64 kg)  02/16/18 143 lb 6.4 oz (65 kg)    Nutrition Diagnosis ? Food-and nutrition-related knowledge deficit related to lack of exposure to information as related to diagnosis of: ? CVD   Nutrition Intervention ? Pt's individual nutrition plan reviewed with pt. ? Benefits of adopting Heart Healthy diet discussed when Med[employer]icts reviewed.   ? Pt given handouts for: ? intuitive eating  Goal(s) ? Pt to identify and limit food sources of saturated fat, trans fat, refined carbohydrates and sodium ? Pt to choose desserts carefully. ? Pt able to name foods that affect blood glucose.  Plan:   Pt to attend nutrition classes ? Nutrition I ? Nutrition II ? Portion Distortion   Will provide client-centered  nutrition education as part of interdisciplinary care  Monitor and evaluate progress toward nutrition goal with team.    [name], MS, RD, LDN 03/07/2018 10:30 AM

## 2018-03-09 ENCOUNTER — Encounter (HOSPITAL_COMMUNITY)
Admission: RE | Admit: 2018-03-09 | Discharge: 2018-03-09 | Disposition: A | Discharge status: Home / Self Care | Payer: Managed Care, Other (non HMO) | Source: Ambulatory Visit | Attending: Internal Medicine | Admitting: Internal Medicine

## 2018-03-09 ENCOUNTER — Ambulatory Visit (HOSPITAL_COMMUNITY): Payer: Commercial Managed Care - PPO

## 2018-03-09 DIAGNOSIS — Z951 Presence of aortocoronary bypass graft: Secondary | ICD-10-CM

## 2018-03-09 NOTE — Progress Notes (Signed)
Reviewed home exercise guidelines with patient including endpoints, temperature precautions, target heart rate and rate of perceived exertion. Pt has a treadmill at home that she is using as her mode of home exercise. Pt voices understanding of instructions given. Sol Passer, MS, ACSM CEP

## 2018-03-10 ENCOUNTER — Other Ambulatory Visit: Payer: Commercial Managed Care - PPO

## 2018-03-11 ENCOUNTER — Other Ambulatory Visit: Payer: Managed Care, Other (non HMO) | Admitting: *Deleted

## 2018-03-11 ENCOUNTER — Ambulatory Visit (HOSPITAL_COMMUNITY): Payer: Commercial Managed Care - PPO

## 2018-03-11 ENCOUNTER — Encounter (HOSPITAL_COMMUNITY)
Admission: RE | Admit: 2018-03-11 | Discharge: 2018-03-11 | Disposition: A | Discharge status: Home / Self Care | Payer: Managed Care, Other (non HMO) | Source: Ambulatory Visit | Attending: Internal Medicine | Admitting: Internal Medicine

## 2018-03-11 DIAGNOSIS — Z951 Presence of aortocoronary bypass graft: Secondary | ICD-10-CM | POA: Diagnosis not present

## 2018-03-11 DIAGNOSIS — Z79899 Other long term (current) drug therapy: Secondary | ICD-10-CM

## 2018-03-11 LAB — LIPID PANEL
Chol/HDL Ratio: 3 ratio (ref 0.0–4.4)
Cholesterol, Total: 145 mg/dL (ref 100–199)
HDL: 48 mg/dL (ref >39)
LDL Calculated: 75 mg/dL (ref 0–99)
Triglycerides: 111 mg/dL (ref 0–149)
VLDL Cholesterol Cal: 22 mg/dL (ref 5–40)

## 2018-03-14 ENCOUNTER — Encounter (HOSPITAL_COMMUNITY)
Admission: RE | Admit: 2018-03-14 | Discharge: 2018-03-14 | Disposition: A | Discharge status: Home / Self Care | Payer: Managed Care, Other (non HMO) | Source: Ambulatory Visit | Attending: Internal Medicine | Admitting: Internal Medicine

## 2018-03-14 ENCOUNTER — Ambulatory Visit (HOSPITAL_COMMUNITY): Payer: Commercial Managed Care - PPO

## 2018-03-14 DIAGNOSIS — Z951 Presence of aortocoronary bypass graft: Secondary | ICD-10-CM | POA: Insufficient documentation

## 2018-03-16 ENCOUNTER — Ambulatory Visit (HOSPITAL_COMMUNITY): Payer: Commercial Managed Care - PPO

## 2018-03-16 ENCOUNTER — Encounter (HOSPITAL_COMMUNITY)
Admission: RE | Admit: 2018-03-16 | Discharge: 2018-03-16 | Disposition: A | Discharge status: Home / Self Care | Payer: Managed Care, Other (non HMO) | Source: Ambulatory Visit | Attending: Internal Medicine | Admitting: Internal Medicine

## 2018-03-16 DIAGNOSIS — Z951 Presence of aortocoronary bypass graft: Secondary | ICD-10-CM

## 2018-03-17 NOTE — Progress Notes (Signed)
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  aspirin 81 MG tablet, Take 1 tablet (81 mg total) by mouth daily., Disp: , Rfl:  .  atorvastatin (LIPITOR) 80 MG tablet, Take 1 tablet (80 mg total) by mouth daily., Disp: 90 tablet, Rfl: 3 .  calcium carbonate (OS-CAL - DOSED IN MG OF ELEMENTAL CALCIUM) 1250 (500 Ca) MG tablet, Take 1 tablet by mouth daily with breakfast., Disp: , Rfl:  .  clopidogrel (PLAVIX) 75 MG tablet, Take 1 tablet (75 mg total) by mouth daily., Disp: 90 tablet, Rfl: 2 .  DULoxetine (CYMBALTA) 60 MG capsule, TAKE ONE CAPSULE BY MOUTH DAILY, Disp: 30 capsule, Rfl: 2 .  famotidine (PEPCID) 40 MG tablet, Take 40 mg by mouth at bedtime. , Disp: , Rfl:  .  lisinopril (PRINIVIL,ZESTRIL) 2.5 MG tablet, Take 1 tablet (2.5 mg total) by mouth daily., Disp: 90 tablet, Rfl: 2 .  metoprolol tartrate (LOPRESSOR) 25 MG tablet, Take 1 tablet (25 mg total) by mouth 2 (two) times daily., Disp: 180 tablet, Rfl: 2 .  Multiple Vitamin (MULTIVITAMIN) tablet, Take 1 tablet by mouth daily., Disp: , Rfl:  .  nitroGLYCERIN (NITROSTAT) 0.4 MG SL tablet, Place 1 tablet (0.4 mg total) under the tongue every 5 (five) minutes as needed., Disp: 25 tablet, Rfl: 3  Past Medical History: Past Medical History:  Diagnosis Date  . Acid reflux   . Coronary artery disease   . Fibroid   . Hyperlipidemia   . Hypertension   . Osteopenia 10/2016   T score -2.1 FRAX's 5.7%/0.3%    Tobacco Use: Social  History   Tobacco Use  Smoking Status Former Smoker  . Last attempt to quit: 09/08/1994  . Years since quitting: 23.5  Smokeless Tobacco Never Used    Labs: Recent Review Flowsheet Data    Labs for ITP Cardiac and Pulmonary Rehab Latest Ref Rng & Units 12/08/2017 12/08/2017 12/08/2017 01/25/2018 03/11/2018   Cholestrol 100 - 199 mg/dL - - - 166 145   LDLCALC 0 - 99 mg/dL - - - 102(H) 75   HDL >39 mg/dL - - - 44 48   Trlycerides 0 - 149 mg/dL - - - 100 111   Hemoglobin A1c 4.8 - 5.6 % - - - - -   PHART 7.350 - 7.450 7.287(L) 7.339(L) - - -   PCO2ART 32.0 - 48.0 mmHg 46.8 42.3 - - -   HCO3 20.0 - 28.0 mmol/L 22.5 22.9 - - -   TCO2 22 - 32 mmol/L 24 24 24  - -   ACIDBASEDEF 0.0 - 2.0 mmol/L 4.0(H) 3.0(H) - - -   O2SAT % 95.0 93.0 - - -      Capillary Blood Glucose: Lab Results  Component Value Date   GLUCAP 138 (H) 12/08/2017   GLUCAP 106 (H) 12/08/2017   GLUCAP 98 12/08/2017   GLUCAP 135 (H) 12/08/2017   GLUCAP 149 (H) 12/07/2017     Exercise Target Goals: Exercise  Program Goal: Individual exercise prescription set using results from initial 6 min walk test and THRR while considering  patient's activity barriers and safety.   Exercise Prescription Goal: Initial exercise prescription builds to 30-45 minutes a day of aerobic activity, 2-3 days per week.  Home exercise guidelines will be given to patient during program as part of exercise prescription that the participant will acknowledge.  Activity Barriers & Risk Stratification: Activity Barriers & Cardiac Risk Stratification - 02/24/18 1115      Activity Barriers & Cardiac Risk Stratification   Activity Barriers  None    Cardiac Risk Stratification  High       6 Minute Walk: 6 Minute Walk    Row Name 02/24/18 1110         6 Minute Walk   Phase  Initial     Distance  1846 feet     Walk Time  6 minutes     # of Rest Breaks  0     MPH  3.5     METS  4.23     RPE  11     Perceived Dyspnea   0     VO2 Peak   14.8     Symptoms  No     Resting HR  58 bpm     Resting BP  122/84     Resting Oxygen Saturation   97 %     Exercise Oxygen Saturation  during 6 min walk  98 %     Max Ex. HR  68 bpm     Max Ex. BP  126/82     2 Minute Post BP  112/68        Oxygen Initial Assessment:   Oxygen Re-Evaluation:   Oxygen Discharge (Final Oxygen Re-Evaluation):   Initial Exercise Prescription: Initial Exercise Prescription - 02/24/18 1100      Date of Initial Exercise RX and Referring Provider   Date  02/24/18    Referring Provider  Glori Bickers MD    Expected Discharge Date  06/03/18      Treadmill   MPH  2.3    Grade  1    Minutes  10    METs  3.08      Recumbant Bike   Level  2.5    Watts  40    Minutes  10    METs  3.94      NuStep   Level  3    SPM  75    Minutes  10    METs  3.5      Prescription Details   Frequency (times per week)  3x    Duration  Progress to 30 minutes of continuous aerobic without signs/symptoms of physical distress      Intensity   THRR 40-80% of Max Heartrate  65-130    Ratings of Perceived Exertion  11-13    Perceived Dyspnea  0-4      Progression   Progression  Continue progressive overload as per policy without signs/symptoms or physical distress.      Resistance Training   Training Prescription  Yes    Weight  4lbs    Reps  10-15       Perform Capillary Blood Glucose checks as needed.  Exercise Prescription Changes: Exercise Prescription Changes    Row Name 03/02/18 [phone] 03/07/18 0952           Response to Exercise   Blood Pressure (Admit)  122/70  108/60      Blood Pressure (Exercise)  120/60  118/80      Blood Pressure (Exit)  102/80  100/60      Heart Rate (Admit)  62 bpm  79 bpm      Heart Rate (Exercise)  87 bpm  101 bpm      Heart Rate (Exit)  62 bpm  67 bpm      Rating of Perceived Exertion (Exercise)  12  12      Symptoms  none  none      Duration  Progress to 30 minutes of  aerobic without signs/symptoms of  physical distress  Progress to 30 minutes of  aerobic without signs/symptoms of physical distress      Intensity  THRR unchanged  THRR unchanged        Progression   Progression  Continue to progress workloads to maintain intensity without signs/symptoms of physical distress.  Continue to progress workloads to maintain intensity without signs/symptoms of physical distress.      Average METs  3.2  3.9        Resistance Training   Training Prescription  No Relaxation day, no weights  Yes      Weight  -  4lbs      Reps  -  10-15      Time  -  10 Minutes        Interval Training   Interval Training  -  No        Treadmill   MPH  2.3  2.4      Grade  1  1      Minutes  10  10      METs  3.08  3.17        Recumbant Bike   Level  2.5  2.5 Upright SciFit bike      Minutes  10  10      METs  3.8  5.1        NuStep   Level  3  4      SPM  75  85      Minutes  10  10      METs  2.7  3.4        Home Exercise Plan   Plans to continue exercise at  -  Home (comment) Patient has a treadmill at home.      Frequency  -  Add 2 additional days to program exercise sessions.      Initial Home Exercises Provided  -  03/09/18         Exercise Comments: Exercise Comments    Row Name 03/02/18 1038 03/07/18 1009 03/09/18 1005       Exercise Comments  Patient toleated 1st exercise session well with symptoms.  Reviewed METs with patient.  Reviewed home exericse guidelines, METs, and goals with patient.        Exercise Goals and Review: Exercise Goals    Row Name 02/24/18 1115             Exercise Goals   Increase Strength and Stamina  Yes       Intervention  Develop an individualized exercise prescription for aerobic and resistive training based on initial evaluation findings, risk stratification, comorbidities and participant's personal goals.;Provide advice, education, support and counseling about physical activity/exercise needs.       Expected Outcomes  Short Term: Increase  workloads from initial exercise prescription for resistance, speed, and METs.;Short Term:  Perform resistance training exercises routinely during rehab and add in resistance training at home;Long Term: Improve cardiorespiratory fitness, muscular endurance and strength as measured by increased METs and functional capacity (6MWT)       Able to understand and use rate of perceived exertion (RPE) scale  Yes       Intervention  Provide education and explanation on how to use RPE scale       Expected Outcomes  Short Term: Able to use RPE daily in rehab to express subjective intensity level;Long Term:  Able to use RPE to guide intensity level when exercising independently       Knowledge and understanding of Target Heart Rate Range (THRR)  Yes       Intervention  Provide education and explanation of THRR including how the numbers were predicted and where they are located for reference       Expected Outcomes  Long Term: Able to use THRR to govern intensity when exercising independently;Short Term: Able to state/look up THRR;Short Term: Able to use daily as guideline for intensity in rehab       Able to check pulse independently  Yes       Intervention  Review the importance of being able to check your own pulse for safety during independent exercise;Provide education and demonstration on how to check pulse in carotid and radial arteries.       Expected Outcomes  Long Term: Able to check pulse independently and accurately;Short Term: Able to explain why pulse checking is important during independent exercise       Understanding of Exercise Prescription  Yes       Intervention  Provide education, explanation, and written materials on patient's individual exercise prescription       Expected Outcomes  Long Term: Able to explain home exercise prescription to exercise independently;Short Term: Able to explain program exercise prescription          Exercise Goals Re-Evaluation : Exercise Goals Re-Evaluation     [place] Name 03/02/18 1038 03/09/18 1005           Exercise Goal Re-Evaluation   Exercise Goals Review  Able to understand and use rate of perceived exertion (RPE) scale;Increase Physical Activity  Able to understand and use rate of perceived exertion (RPE) scale;Increase Physical Activity;Knowledge and understanding of Target Heart Rate Range (THRR);Able to check pulse independently;Understanding of Exercise Prescription      Comments  Patient able to understand and use RPE scale appropriately.   Reviewed home exercise guidelines with patient including THRR, RPE scale, and endpoints for exercise. Pt has a treadmill at home and is walking 30 minutes, 2 days/week. Pt knows how to count her pulse independently.      Expected Outcomes  Increase workloads as tolerated to help improve cardiorespiratory fitness.  Patient will walk on her treadmill at home, 30 minutes, at least 2 days/week.         Discharge Exercise Prescription (Final Exercise Prescription Changes): Exercise Prescription Changes - 03/07/18 0952      Response to Exercise   Blood Pressure (Admit)  108/60    Blood Pressure (Exercise)  118/80    Blood Pressure (Exit)  100/60    Heart Rate (Admit)  79 bpm    Heart Rate (Exercise)  101 bpm    Heart Rate (Exit)  67 bpm    Rating of Perceived Exertion (Exercise)  12    Symptoms  none    Duration  Progress to 30 minutes  of  aerobic without signs/symptoms of physical distress    Intensity  THRR unchanged      Progression   Progression  Continue to progress workloads to maintain intensity without signs/symptoms of physical distress.    Average METs  3.9      Resistance Training   Training Prescription  Yes    Weight  4lbs    Reps  10-15    Time  10 Minutes      Interval Training   Interval Training  No      Treadmill   MPH  2.4    Grade  1    Minutes  10    METs  3.17      Recumbant Bike   Level  2.5   Upright SciFit bike   Minutes  10    METs  5.1      NuStep    Level  4    SPM  85    Minutes  10    METs  3.4      Home Exercise Plan   Plans to continue exercise at  Home (comment)   Patient has a treadmill at home.   Frequency  Add 2 additional days to program exercise sessions.    Initial Home Exercises Provided  03/09/18       Nutrition:  Target Goals: Understanding of nutrition guidelines, daily intake of sodium 1500mg , cholesterol 200mg , calories 30% from fat and 7% or less from saturated fats, daily to have 5 or more servings of fruits and vegetables.  Biometrics: Pre Biometrics - 02/24/18 1113      Pre Biometrics   Height  5' 2.25" (1.581 m)    Weight  64 kg    Waist Circumference  32.5 inches    Hip Circumference  39.5 inches    Waist to Hip Ratio  0.82 %    BMI (Calculated)  25.6    Triceps Skinfold  31 mm    % Body Fat  39.7 %    Grip Strength  27.5 kg    Flexibility  12 in    Single Leg Stand  5 seconds        Nutrition Therapy Plan and Nutrition Goals: Nutrition Therapy & Goals - 03/07/18 1031      Nutrition Therapy   Diet  heart healthy      Personal Nutrition Goals   Nutrition Goal  Pt to identify and limit food sources of saturated fat, trans fat, refined carbohydrates and sodium    Personal Goal #2  Pt to choose desserts carefully.    Personal Goal #3  Pt able to name foods that affect blood glucose.      Intervention Plan   Intervention  Prescribe, educate and counsel regarding individualized specific dietary modifications aiming towards targeted core components such as weight, hypertension, lipid management, diabetes, heart failure and other comorbidities.    Expected Outcomes  Short Term Goal: Understand basic principles of dietary content, such as calories, fat, sodium, cholesterol and nutrients.;Long Term Goal: Adherence to prescribed nutrition plan.       Nutrition Assessments: Nutrition Assessments - 02/24/18 1540      MEDFICTS Scores   Pre Score  15       Nutrition Goals  Re-Evaluation:   Nutrition Goals Re-Evaluation:   Nutrition Goals Discharge (Final Nutrition Goals Re-Evaluation):   Psychosocial: Target Goals: Acknowledge presence or absence of significant depression and/or stress, maximize coping skills, provide positive support system. Participant is able  to verbalize types and ability to use techniques and skills needed for reducing stress and depression.  Initial Review & Psychosocial Screening: Initial Psych Review & Screening - 02/24/18 1057      Initial Review   Current issues with  Current Depression      Family Dynamics   Good Support System?  Yes   Pt lists her husband, mother, and daughters as soures of support.      Barriers   Psychosocial barriers to participate in program  The patient should benefit from training in stress management and relaxation.      Screening Interventions   Interventions  Encouraged to exercise;Other (comment)   Pt recently started on medications for depression and reports that this has helped.        Quality of Life Scores: Quality of Life - 02/24/18 1054      Quality of Life   Select  Quality of Life      Quality of Life Scores   Health/Function Pre  25.53 %    Socioeconomic Pre  20.06 %    Psych/Spiritual Pre  24.92 %    Family Pre  30 %    GLOBAL Pre  24.79 %      Scores of 19 and below usually indicate a poorer quality of life in these areas.  A difference of  2-3 points is a clinically meaningful difference.  A difference of 2-3 points in the total score of the Quality of Life Index has been associated with significant improvement in overall quality of life, self-image, physical symptoms, and general health in studies assessing change in quality of life.  PHQ-9: Recent Review Flowsheet Data    Depression screen [facility] 2/9 03/02/2018   Decreased Interest 0   Down, Depressed, Hopeless 0   PHQ - 2 Score 0     Interpretation of Total Score  Total Score Depression Severity:  1-4 = Minimal  depression, 5-9 = Mild depression, 10-14 = Moderate depression, 15-19 = Moderately severe depression, 20-27 = Severe depression   Psychosocial Evaluation and Intervention:   Psychosocial Re-Evaluation: Psychosocial Re-Evaluation    [place] Name 03/17/18 1055             Psychosocial Re-Evaluation   Current issues with  Current Depression       Comments  Fraser Din has not voiced any increased stressors or increase in depression       Expected Outcomes  Will continue to offer emotional support as needed.       Interventions  Encouraged to attend Cardiac Rehabilitation for the exercise;Stress management education       Continue Psychosocial Services   No Follow up required          Psychosocial Discharge (Final Psychosocial Re-Evaluation): Psychosocial Re-Evaluation - 03/17/18 1055      Psychosocial Re-Evaluation   Current issues with  Current Depression    Comments  Fraser Din has not voiced any increased stressors or increase in depression    Expected Outcomes  Will continue to offer emotional support as needed.    Interventions  Encouraged to attend Cardiac Rehabilitation for the exercise;Stress management education    Continue Psychosocial Services   No Follow up required       Vocational Rehabilitation: Provide vocational rehab assistance to qualifying candidates.   Vocational Rehab Evaluation & Intervention: Vocational Rehab - 02/24/18 1053      Initial Vocational Rehab Evaluation & Intervention   Assessment shows need for Vocational Rehabilitation  No  Education: Education Goals: Education classes will be provided on a weekly basis, covering required topics. Participant will state understanding/return demonstration of topics presented.  Learning Barriers/Preferences: Learning Barriers/Preferences - 02/24/18 1129      Learning Barriers/Preferences   Learning Barriers  Sight    Learning Preferences  Written Material       Education Topics: Count Your Pulse:  -Group  instruction provided by verbal instruction, demonstration, patient participation and written materials to support subject.  Instructors address importance of being able to find your pulse and how to count your pulse when at home without a heart monitor.  Patients get hands on experience counting their pulse with staff help and individually.   CARDIAC REHAB PHASE II EXERCISE from 03/04/2018 in [place]  Date  03/04/18  Educator  RN  Instruction Review Code  2- Demonstrated Understanding      Heart Attack, Angina, and Risk Factor Modification:  -Group instruction provided by verbal instruction, video, and written materials to support subject.  Instructors address signs and symptoms of angina and heart attacks.    Also discuss risk factors for heart disease and how to make changes to improve heart health risk factors.   Functional Fitness:  -Group instruction provided by verbal instruction, demonstration, patient participation, and written materials to support subject.  Instructors address safety measures for doing things around the house.  Discuss how to get up and down off the floor, how to pick things up properly, how to safely get out of a chair without assistance, and balance training.   Meditation and Mindfulness:  -Group instruction provided by verbal instruction, patient participation, and written materials to support subject.  Instructor addresses importance of mindfulness and meditation practice to help reduce stress and improve awareness.  Instructor also leads participants through a meditation exercise.    Stretching for Flexibility and Mobility:  -Group instruction provided by verbal instruction, patient participation, and written materials to support subject.  Instructors lead participants through series of stretches that are designed to increase flexibility thus improving mobility.  These stretches are additional exercise for major muscle groups that  are typically performed during regular warm up and cool down.   Hands Only CPR:  -Group verbal, video, and participation provides a basic overview of AHA guidelines for community CPR. Role-play of emergencies allow participants the opportunity to practice calling for help and chest compression technique with discussion of AED use.   Hypertension: -Group verbal and written instruction that provides a basic overview of hypertension including the most recent diagnostic guidelines, risk factor reduction with self-care instructions and medication management.    Nutrition I class: Heart Healthy Eating:  -Group instruction provided by PowerPoint slides, verbal discussion, and written materials to support subject matter. The instructor gives an explanation and review of the Therapeutic Lifestyle Changes diet recommendations, which includes a discussion on lipid goals, dietary fat, sodium, fiber, plant stanol/sterol esters, sugar, and the components of a well-balanced, healthy diet.   Nutrition II class: Lifestyle Skills:  -Group instruction provided by PowerPoint slides, verbal discussion, and written materials to support subject matter. The instructor gives an explanation and review of label reading, grocery shopping for heart health, heart healthy recipe modifications, and ways to make healthier choices when eating out.   Diabetes Question & Answer:  -Group instruction provided by PowerPoint slides, verbal discussion, and written materials to support subject matter. The instructor gives an explanation and review of diabetes co-morbidities, pre- and post-prandial blood glucose goals, pre-exercise  blood glucose goals, signs, symptoms, and treatment of hypoglycemia and hyperglycemia, and foot care basics.   Diabetes Blitz:  -Group instruction provided by PowerPoint slides, verbal discussion, and written materials to support subject matter. The instructor gives an explanation and review of the  physiology behind type 1 and type 2 diabetes, diabetes medications and rational behind using different medications, pre- and post-prandial blood glucose recommendations and Hemoglobin A1c goals, diabetes diet, and exercise including blood glucose guidelines for exercising safely.    Portion Distortion:  -Group instruction provided by PowerPoint slides, verbal discussion, written materials, and food models to support subject matter. The instructor gives an explanation of serving size versus portion size, changes in portions sizes over the last 20 years, and what consists of a serving from each food group.   Stress Management:  -Group instruction provided by verbal instruction, video, and written materials to support subject matter.  Instructors review role of stress in heart disease and how to cope with stress positively.     CARDIAC REHAB PHASE II EXERCISE from 03/16/2018 in [place]  Date  03/09/18  Educator  RN  Instruction Review Code  2- Demonstrated Understanding      Exercising on Your Own:  -Group instruction provided by verbal instruction, power point, and written materials to support subject.  Instructors discuss benefits of exercise, components of exercise, frequency and intensity of exercise, and end points for exercise.  Also discuss use of nitroglycerin and activating EMS.  Review options of places to exercise outside of rehab.  Review guidelines for sex with heart disease.   Cardiac Drugs I:  -Group instruction provided by verbal instruction and written materials to support subject.  Instructor reviews cardiac drug classes: antiplatelets, anticoagulants, beta blockers, and statins.  Instructor discusses reasons, side effects, and lifestyle considerations for each drug class.   Cardiac Drugs II:  -Group instruction provided by verbal instruction and written materials to support subject.  Instructor reviews cardiac drug classes: angiotensin  converting enzyme inhibitors (ACE-I), angiotensin II receptor blockers (ARBs), nitrates, and calcium channel blockers.  Instructor discusses reasons, side effects, and lifestyle considerations for each drug class.   Anatomy and Physiology of the Circulatory System:  Group verbal and written instruction and models provide basic cardiac anatomy and physiology, with the coronary electrical and arterial systems. Review of: AMI, Angina, Valve disease, Heart Failure, Peripheral Artery Disease, Cardiac Arrhythmia, Pacemakers, and the ICD.   CARDIAC REHAB PHASE II EXERCISE from 03/16/2018 in [place]  Date  03/16/18  Educator  RN  Instruction Review Code  2- Demonstrated Understanding      Other Education:  -Group or individual verbal, written, or video instructions that support the educational goals of the cardiac rehab program.   Holiday Eating Survival Tips:  -Group instruction provided by PowerPoint slides, verbal discussion, and written materials to support subject matter. The instructor gives patients tips, tricks, and techniques to help them not only survive but enjoy the holidays despite the onslaught of food that accompanies the holidays.   Knowledge Questionnaire Score: Knowledge Questionnaire Score - 02/24/18 1053      Knowledge Questionnaire Score   Pre Score  21/24       Core Components/Risk Factors/Patient Goals at Admission: Personal Goals and Risk Factors at Admission - 02/24/18 1129      Core Components/Risk Factors/Patient Goals on Admission    Weight Management  Yes    Intervention  Weight Management: Develop a combined nutrition  and exercise program designed to reach desired caloric intake, while maintaining appropriate intake of nutrient and fiber, sodium and fats, and appropriate energy expenditure required for the weight goal.;Weight Management: Provide education and appropriate resources to help participant work on and attain dietary  goals.;Weight Management/Obesity: Establish reasonable short term and long term weight goals.    Admit Weight  141 lb 1.5 oz (64 kg)    Goal Weight: Long Term  135 lb (61.2 kg)    Expected Outcomes  Short Term: Continue to assess and modify interventions until short term weight is achieved;Long Term: Adherence to nutrition and physical activity/exercise program aimed toward attainment of established weight goal;Weight Loss: Understanding of general recommendations for a balanced deficit meal plan, which promotes 1-2 lb weight loss per week and includes a negative energy balance of [phone] kcal/d;Weight Maintenance: Understanding of the daily nutrition guidelines, which includes 25-35% calories from fat, 7% or less cal from saturated fats, less than 200mg  cholesterol, less than 1.5gm of sodium, & 5 or more servings of fruits and vegetables daily;Understanding recommendations for meals to include 15-35% energy as protein, 25-35% energy from fat, 35-60% energy from carbohydrates, less than 200mg  of dietary cholesterol, 20-35 gm of total fiber daily;Understanding of distribution of calorie intake throughout the day with the consumption of 4-5 meals/snacks    Hypertension  Yes    Intervention  Provide education on lifestyle modifcations including regular physical activity/exercise, weight management, moderate sodium restriction and increased consumption of fresh fruit, vegetables, and low fat dairy, alcohol moderation, and smoking cessation.;Monitor prescription use compliance.    Expected Outcomes  Short Term: Continued assessment and intervention until BP is < 140/28mm HG in hypertensive participants. < 130/32mm HG in hypertensive participants with diabetes, heart failure or chronic kidney disease.;Long Term: Maintenance of blood pressure at goal levels.    Lipids  Yes    Intervention  Provide education and support for participant on nutrition & aerobic/resistive exercise along with prescribed medications to  achieve LDL 70mg , HDL >40mg .    Expected Outcomes  Short Term: Participant states understanding of desired cholesterol values and is compliant with medications prescribed. Participant is following exercise prescription and nutrition guidelines.;Long Term: Cholesterol controlled with medications as prescribed, with individualized exercise RX and with personalized nutrition plan. Value goals: LDL < 70mg , HDL > 40 mg.       Core Components/Risk Factors/Patient Goals Review:  Goals and Risk Factor Review    Row Name 03/17/18 1058             Core Components/Risk Factors/Patient Goals Review   Personal Goals Review  Weight Management/Obesity;Lipids;Hypertension       Review  Pat's vital signs have been stable. Fraser Din has been doing well with exercise.       Expected Outcomes  Patient will continue to participate in phase 2 cardiac rehab for exercise, nutrtion and lifestyle modifications.          Core Components/Risk Factors/Patient Goals at Discharge (Final Review):  Goals and Risk Factor Review - 03/17/18 1058      Core Components/Risk Factors/Patient Goals Review   Personal Goals Review  Weight Management/Obesity;Lipids;Hypertension    Review  Pat's vital signs have been stable. Fraser Din has been doing well with exercise.    Expected Outcomes  Patient will continue to participate in phase 2 cardiac rehab for exercise, nutrtion and lifestyle modifications.       ITP Comments: ITP Comments    Row Name 02/24/18 1110 03/15/18 1557  ITP Comments  Dr. Fransico Him, Medical Director   30 Day ITP Review. Fraser Din is off to a good start to exercise         Comments: See ITP comments.Barnet Pall, RN,BSN 03/17/2018 11:17 AM

## 2018-03-18 ENCOUNTER — Encounter (HOSPITAL_COMMUNITY): Payer: Managed Care, Other (non HMO)

## 2018-03-18 ENCOUNTER — Ambulatory Visit (HOSPITAL_COMMUNITY): Payer: Commercial Managed Care - PPO

## 2018-03-21 ENCOUNTER — Ambulatory Visit (HOSPITAL_COMMUNITY): Payer: Commercial Managed Care - PPO

## 2018-03-21 ENCOUNTER — Encounter (HOSPITAL_COMMUNITY)
Admission: RE | Admit: 2018-03-21 | Discharge: 2018-03-21 | Disposition: A | Discharge status: Home / Self Care | Payer: Managed Care, Other (non HMO) | Source: Ambulatory Visit | Attending: Internal Medicine | Admitting: Internal Medicine

## 2018-03-21 DIAGNOSIS — Z951 Presence of aortocoronary bypass graft: Secondary | ICD-10-CM | POA: Diagnosis not present

## 2018-03-23 ENCOUNTER — Encounter (HOSPITAL_COMMUNITY)
Admission: RE | Admit: 2018-03-23 | Discharge: 2018-03-23 | Disposition: A | Discharge status: Home / Self Care | Payer: Managed Care, Other (non HMO) | Source: Ambulatory Visit | Attending: Internal Medicine | Admitting: Internal Medicine

## 2018-03-23 ENCOUNTER — Ambulatory Visit (HOSPITAL_COMMUNITY): Payer: Commercial Managed Care - PPO

## 2018-03-23 DIAGNOSIS — Z951 Presence of aortocoronary bypass graft: Secondary | ICD-10-CM

## 2018-03-23 NOTE — Progress Notes (Signed)
[name] 58 y.o. female Nutrition Note Spoke with pt. Nutrition Plan and Nutrition Survey goals reviewed with pt. Pt is following a Heart Healthy diet. Pt wants to lose wt, ~5 lbs. Pt has been trying to lose wt by watching her portion sizes and by practicing intuitive eating. Pt shared she has been managing her sweet tooth, with mindful/intuitive eating, and has been avoiding being overly restrictive with sweets. Pt will have a serving of girl scout cookies 1-2x a week and have fruit or greek yogurt other nights of the week. Pt shared she has found intuitive eating exercises to be harder than she previously thought, sometimes forgets to check in when eating. Recommended pt practice asking themselves where they falls on a hunger scale before, half way through a meal and after a meal. Praised pt for making changes and for all of their hard work, encouraged them to keep it up. Pt expressed understanding of the information reviewed. Pt aware of nutrition education classes offered and does plan on attending nutrition classes.  Lab Results  Component Value Date   HGBA1C 5.6 12/06/2017    Wt Readings from Last 3 Encounters:  02/28/18 140 lb (63.5 kg)  02/24/18 141 lb 1.5 oz (64 kg)  02/16/18 143 lb 6.4 oz (65 kg)    Nutrition Diagnosis ? Food-and nutrition-related knowledge deficit related to lack of exposure to information as related to diagnosis of: ? CVD   Nutrition Intervention ? Pt's individual nutrition plan reviewed with pt. ? Pt given handouts for: ? intuitive eating  Goal(s) ? Pt to identify and limit food sources of saturated fat, trans fat, refined carbohydrates and sodium ? Pt to choose desserts carefully. ? Pt able to name foods that affect blood glucose.  Plan:   Pt to attend nutrition classes ? Nutrition I ? Nutrition II ? Portion Distortion   Will provide client-centered nutrition education as part of interdisciplinary care  Monitor and evaluate progress toward  nutrition goal with team.    [name], MS, RD, LDN 03/23/2018 10:30 AM

## 2018-03-25 ENCOUNTER — Encounter (HOSPITAL_COMMUNITY)
Admission: RE | Admit: 2018-03-25 | Discharge: 2018-03-25 | Disposition: A | Discharge status: Home / Self Care | Payer: Managed Care, Other (non HMO) | Source: Ambulatory Visit | Attending: Internal Medicine | Admitting: Internal Medicine

## 2018-03-25 ENCOUNTER — Ambulatory Visit (HOSPITAL_COMMUNITY): Payer: Commercial Managed Care - PPO

## 2018-03-25 DIAGNOSIS — Z951 Presence of aortocoronary bypass graft: Secondary | ICD-10-CM | POA: Diagnosis not present

## 2018-03-28 ENCOUNTER — Ambulatory Visit (HOSPITAL_COMMUNITY): Payer: Commercial Managed Care - PPO

## 2018-03-28 ENCOUNTER — Ambulatory Visit (HOSPITAL_COMMUNITY)
Admission: RE | Admit: 2018-03-28 | Discharge: 2018-03-28 | Disposition: A | Discharge status: Home / Self Care | Payer: Managed Care, Other (non HMO) | Source: Ambulatory Visit | Attending: Internal Medicine | Admitting: Internal Medicine

## 2018-03-28 ENCOUNTER — Encounter (HOSPITAL_COMMUNITY): Payer: Self-pay | Admitting: Internal Medicine

## 2018-03-28 ENCOUNTER — Other Ambulatory Visit: Payer: Self-pay

## 2018-03-28 ENCOUNTER — Encounter (HOSPITAL_COMMUNITY): Payer: Managed Care, Other (non HMO)

## 2018-03-28 VITALS — BP 108/78 | HR 58 | Wt 141.8 lb

## 2018-03-28 DIAGNOSIS — Z7902 Long term (current) use of antithrombotics/antiplatelets: Secondary | ICD-10-CM | POA: Insufficient documentation

## 2018-03-28 DIAGNOSIS — M8580 Other specified disorders of bone density and structure, unspecified site: Secondary | ICD-10-CM | POA: Diagnosis not present

## 2018-03-28 DIAGNOSIS — I10 Essential (primary) hypertension: Secondary | ICD-10-CM | POA: Diagnosis not present

## 2018-03-28 DIAGNOSIS — Z79899 Other long term (current) drug therapy: Secondary | ICD-10-CM | POA: Insufficient documentation

## 2018-03-28 DIAGNOSIS — Z87891 Personal history of nicotine dependence: Secondary | ICD-10-CM | POA: Diagnosis not present

## 2018-03-28 DIAGNOSIS — I2510 Atherosclerotic heart disease of native coronary artery without angina pectoris: Secondary | ICD-10-CM | POA: Insufficient documentation

## 2018-03-28 DIAGNOSIS — Z882 Allergy status to sulfonamides status: Secondary | ICD-10-CM | POA: Insufficient documentation

## 2018-03-28 DIAGNOSIS — Z8249 Family history of ischemic heart disease and other diseases of the circulatory system: Secondary | ICD-10-CM | POA: Insufficient documentation

## 2018-03-28 DIAGNOSIS — Z833 Family history of diabetes mellitus: Secondary | ICD-10-CM | POA: Diagnosis not present

## 2018-03-28 DIAGNOSIS — E785 Hyperlipidemia, unspecified: Secondary | ICD-10-CM | POA: Insufficient documentation

## 2018-03-28 DIAGNOSIS — Z951 Presence of aortocoronary bypass graft: Secondary | ICD-10-CM | POA: Diagnosis not present

## 2018-03-28 DIAGNOSIS — Z7982 Long term (current) use of aspirin: Secondary | ICD-10-CM | POA: Diagnosis not present

## 2018-03-28 DIAGNOSIS — K219 Gastro-esophageal reflux disease without esophagitis: Secondary | ICD-10-CM | POA: Insufficient documentation

## 2018-03-28 NOTE — Patient Instructions (Signed)
Your physician wants you to follow-up in: ONE YEAR You will receive a reminder letter in the mail two months in advance. If you don't receive a letter, please call our office to schedule the follow-up appointment.  

## 2018-03-28 NOTE — Progress Notes (Signed)
Advanced Heart Failure Clinic Note   Referring Physician: PCP: Vicenta Aly, FNP PCP-Cardiologist: Shirlee More, MD   HPI:  [name] is a 58 y.o. female with CAD s/p CABG x 3, HTN, and HLD.   Admitted 12/03/17 after an episode of CP and presyncope in the setting of severe emotional stress. Initial trop negative. Cardiac CT showed 3v CAD. 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 EF was normal by Echo as below. Seen by TCTS and recommended for CABG.   Pt underwent CABG x 3 on 12/07/17 including LIMA to LAD, SVG to RCA, and SVG to OM. Pt had a relatively uncomplicated post op course.  Epicardial pacing wires removed 12/11/17 and chest tube sutures removed 12/12/17. Started on plavix day of discharge.   Here for f/u. Doing great. Going to CR. Exercising without problem. Nerves in chest are waking up so some soreness. Mood much improved. No angina. No edema    CT FFR 12/05/17 FFR 0.7 mid LAD -> hemodynamically significant proximal LAD stenosis FFR not significant in the LCx or RCA.  Echo 12/05/17 LVEF 60-65%, Grade 1 DD, Mild AI, Trivial MR, Trivial TR.  LHC 12/06/17  Ost RCA lesion is 60% stenosed.  Mid RCA-1 lesion is 50% stenosed.  Mid RCA-2 lesion is 80% stenosed.  Prox Cx to Mid Cx lesion is 80% stenosed.  Mid Cx to Dist Cx lesion is 50% stenosed.  Prox Cx lesion is 30% stenosed.  Ost LAD lesion is 80% stenosed.  Prox LAD lesion is 70% stenosed.  Mid LAD lesion is 30% stenosed.  The left ventricular systolic function is normal.  LV end diastolic pressure is normal.  The left ventricular ejection fraction is greater than 65% by visual estimate.  There is no mitral valve regurgitation.  PFTs 12/06/17 FVC 2.81 (89%) FEV1 2.09 (85%)  Review of systems complete and found  to be negative unless listed in HPI.    Past Medical History:  Diagnosis Date  . Acid reflux   . Coronary artery disease   . Fibroid   . Hyperlipidemia   . Hypertension   . Osteopenia 10/2016   T score -2.1 FRAX's 5.7%/0.3%    Current Outpatient Medications  Medication Sig Dispense Refill  . aspirin 81 MG tablet Take 1 tablet (81 mg total) by mouth daily.    Marland Kitchen atorvastatin (LIPITOR) 80 MG tablet Take 1 tablet (80 mg total) by mouth daily. 90 tablet 3  . calcium carbonate (OS-CAL - DOSED IN MG OF ELEMENTAL CALCIUM) 1250 (500 Ca) MG tablet Take 1 tablet by mouth daily with breakfast.    . clopidogrel (PLAVIX) 75 MG tablet Take 1 tablet (75 mg total) by mouth daily. 90 tablet 2  . DULoxetine (CYMBALTA) 60 MG capsule TAKE ONE CAPSULE BY MOUTH DAILY 30 capsule 2  . famotidine (PEPCID) 40 MG tablet Take 40 mg by mouth at bedtime.     Marland Kitchen lisinopril (PRINIVIL,ZESTRIL) 2.5 MG tablet Take 1 tablet (2.5 mg total) by mouth daily. 90 tablet 2  . metoprolol tartrate (LOPRESSOR) 25 MG tablet Take 1 tablet (25 mg total) by mouth 2 (two) times daily. 180 tablet 2  . Multiple Vitamin (MULTIVITAMIN) tablet Take 1 tablet by mouth daily.    . nitroGLYCERIN (NITROSTAT) 0.4 MG SL tablet Place 1 tablet (0.4 mg total) under the tongue  every 5 (five) minutes as needed. 25 tablet 3   No current facility-administered medications for this encounter.     Allergies  Allergen Reactions  . Sulfa Antibiotics Swelling      Social History   Socioeconomic History  . Marital status: Married    Spouse name: Not on file  . Number of children: Not on file  . Years of education: Not on file  . Highest education level: Not on file  Occupational History  . Occupation: Software engineer  Social Needs  . Financial resource strain: Not hard at all  . Food insecurity:    Worry: Never true    Inability: Never true  . Transportation needs:    Medical: No    Non-medical: No  Tobacco Use  . Smoking status: Former Smoker      Last attempt to quit: 09/08/1994    Years since quitting: 23.5  . Smokeless tobacco: Never Used  Substance and Sexual Activity  . Alcohol use: Yes    Alcohol/week: 0.0 standard drinks    Comment: occ  . Drug use: No  . Sexual activity: Yes  Lifestyle  . Physical activity:    Days per week: 6 days    Minutes per session: 30 min  . Stress: Only a little  Relationships  . Social connections:    Talks on phone: Not on file    Gets together: Not on file    Attends religious service: Not on file    Active member of club or organization: Not on file    Attends meetings of clubs or organizations: Not on file    Relationship status: Not on file  . Intimate partner violence:    Fear of current or ex partner: Not on file    Emotionally abused: Not on file    Physically abused: Not on file    Forced sexual activity: Not on file  Other Topics Concern  . Not on file  Social History Narrative  . Not on file      Family History  Problem Relation Age of Onset  . Hypertension Mother   . Heart disease Mother   . Diabetes Father   . Hypertension Father   . Heart disease Father   . Diabetes Paternal Grandfather     Vitals:   03/28/18 1025  BP: 108/78  Pulse: (!) 58  SpO2: 98%  Weight: 64.3 kg (141 lb 12.8 oz)   Wt Readings from Last 3 Encounters:  03/28/18 64.3 kg (141 lb 12.8 oz)  02/28/18 63.5 kg (140 lb)  02/24/18 64 kg (141 lb 1.5 oz)    PHYSICAL EXAM: General:  Well appearing. No resp difficulty HEENT: normal Neck: supple. no JVD. Carotids 2+ bilat; no bruits. No lymphadenopathy or thryomegaly appreciated. Cor: PMI nondisplaced. Regular rate & rhythm. No rubs, gallops or murmurs. Lungs: clear Abdomen: soft, nontender, nondistended. No hepatosplenomegaly. No bruits or masses. Good bowel sounds. Extremities: no cyanosis, clubbing, rash, edema Neuro: alert & orientedx3, cranial nerves grossly intact. moves all 4 extremities w/o difficulty. Affect pleasant  ECG:  12/08/17 NSR 89 bpm  ASSESSMENT & PLAN:  1. CAD s/p CABG x 3, LIMA to LAD, SVG to RCA, SVG to OM - Overall doing well. No further angina.  - Continue CR  - Continue ASA, statin, and plavix.   2. HTN - Well controlled on current regimen - Continue lopressor 25 mg BID - Continue lisinopril 2.5 mg daily   Glori Bickers, MD  10:59 AM

## 2018-03-28 NOTE — Addendum Note (Signed)
Encounter addended by: Jovita Kussmaul, RN on: 03/28/2018 11:05 AM  Actions taken: Clinical Note Signed

## 2018-03-30 ENCOUNTER — Encounter (HOSPITAL_COMMUNITY): Payer: Managed Care, Other (non HMO)

## 2018-03-30 ENCOUNTER — Ambulatory Visit (HOSPITAL_COMMUNITY): Payer: Commercial Managed Care - PPO

## 2018-04-01 ENCOUNTER — Ambulatory Visit (HOSPITAL_COMMUNITY): Payer: Commercial Managed Care - PPO

## 2018-04-01 ENCOUNTER — Encounter (HOSPITAL_COMMUNITY)
Admission: RE | Admit: 2018-04-01 | Discharge: 2018-04-01 | Disposition: A | Discharge status: Home / Self Care | Payer: Managed Care, Other (non HMO) | Source: Ambulatory Visit | Attending: Internal Medicine | Admitting: Internal Medicine

## 2018-04-01 DIAGNOSIS — Z951 Presence of aortocoronary bypass graft: Secondary | ICD-10-CM | POA: Diagnosis not present

## 2018-04-04 ENCOUNTER — Encounter (HOSPITAL_COMMUNITY)
Admission: RE | Admit: 2018-04-04 | Discharge: 2018-04-04 | Disposition: A | Discharge status: Home / Self Care | Payer: Managed Care, Other (non HMO) | Source: Ambulatory Visit | Attending: Internal Medicine | Admitting: Internal Medicine

## 2018-04-04 ENCOUNTER — Ambulatory Visit (HOSPITAL_COMMUNITY): Payer: Commercial Managed Care - PPO

## 2018-04-04 DIAGNOSIS — Z951 Presence of aortocoronary bypass graft: Secondary | ICD-10-CM | POA: Diagnosis not present

## 2018-04-06 ENCOUNTER — Encounter (HOSPITAL_COMMUNITY)
Admission: RE | Admit: 2018-04-06 | Discharge: 2018-04-06 | Disposition: A | Discharge status: Home / Self Care | Payer: Managed Care, Other (non HMO) | Source: Ambulatory Visit | Attending: Internal Medicine | Admitting: Internal Medicine

## 2018-04-06 ENCOUNTER — Ambulatory Visit (HOSPITAL_COMMUNITY): Payer: Commercial Managed Care - PPO

## 2018-04-06 DIAGNOSIS — Z951 Presence of aortocoronary bypass graft: Secondary | ICD-10-CM | POA: Diagnosis not present

## 2018-04-08 ENCOUNTER — Ambulatory Visit (HOSPITAL_COMMUNITY): Payer: Commercial Managed Care - PPO

## 2018-04-08 ENCOUNTER — Encounter (HOSPITAL_COMMUNITY): Payer: Managed Care, Other (non HMO)

## 2018-04-11 ENCOUNTER — Ambulatory Visit (HOSPITAL_COMMUNITY): Payer: Commercial Managed Care - PPO

## 2018-04-11 ENCOUNTER — Encounter (HOSPITAL_COMMUNITY)
Admission: RE | Admit: 2018-04-11 | Discharge: 2018-04-11 | Disposition: A | Discharge status: Home / Self Care | Payer: Managed Care, Other (non HMO) | Source: Ambulatory Visit | Attending: Internal Medicine | Admitting: Internal Medicine

## 2018-04-11 DIAGNOSIS — Z951 Presence of aortocoronary bypass graft: Secondary | ICD-10-CM | POA: Insufficient documentation

## 2018-04-13 ENCOUNTER — Encounter (HOSPITAL_COMMUNITY)
Admission: RE | Admit: 2018-04-13 | Discharge: 2018-04-13 | Disposition: A | Discharge status: Home / Self Care | Payer: Managed Care, Other (non HMO) | Source: Ambulatory Visit | Attending: Internal Medicine | Admitting: Internal Medicine

## 2018-04-13 ENCOUNTER — Ambulatory Visit (HOSPITAL_COMMUNITY): Payer: Commercial Managed Care - PPO

## 2018-04-13 DIAGNOSIS — Z951 Presence of aortocoronary bypass graft: Secondary | ICD-10-CM | POA: Diagnosis not present

## 2018-04-14 NOTE — Progress Notes (Addendum)
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  aspirin 81 MG tablet, Take 1 tablet (81 mg total) by mouth daily., Disp: , Rfl:  .  atorvastatin (LIPITOR) 80 MG tablet, Take 1 tablet (80 mg total) by mouth daily., Disp: 90 tablet, Rfl: 3 .  calcium carbonate (OS-CAL - DOSED IN MG OF ELEMENTAL CALCIUM) 1250 (500 Ca) MG tablet, Take 1 tablet by mouth daily with breakfast., Disp: , Rfl:  .  clopidogrel (PLAVIX) 75 MG tablet, Take 1 tablet (75 mg total) by mouth daily., Disp: 90 tablet, Rfl: 2 .  DULoxetine (CYMBALTA) 60 MG capsule, TAKE ONE CAPSULE BY MOUTH DAILY, Disp: 30 capsule, Rfl: 2 .  famotidine (PEPCID) 40 MG tablet, Take 40 mg by mouth at bedtime. , Disp: , Rfl:  .  lisinopril (PRINIVIL,ZESTRIL) 2.5 MG tablet, Take 1 tablet (2.5 mg total) by mouth daily., Disp: 90 tablet, Rfl: 2 .  metoprolol tartrate (LOPRESSOR) 25 MG tablet, Take 1 tablet (25 mg total) by mouth 2 (two) times daily., Disp: 180 tablet, Rfl: 2 .  Multiple Vitamin (MULTIVITAMIN) tablet, Take 1 tablet by mouth daily., Disp: , Rfl:  .  nitroGLYCERIN (NITROSTAT) 0.4 MG SL tablet, Place 1 tablet (0.4 mg total) under the tongue every 5 (five) minutes as needed., Disp: 25 tablet, Rfl: 3  Past Medical History: Past Medical History:  Diagnosis Date  . Acid reflux   . Coronary artery disease   . Fibroid   . Hyperlipidemia   . Hypertension   . Osteopenia 10/2016   T score -2.1 FRAX's 5.7%/0.3%    Tobacco Use: Social  History   Tobacco Use  Smoking Status Former Smoker  . Last attempt to quit: 09/08/1994  . Years since quitting: 23.6  Smokeless Tobacco Never Used    Labs: Recent Review Flowsheet Data    Labs for ITP Cardiac and Pulmonary Rehab Latest Ref Rng & Units 12/08/2017 12/08/2017 12/08/2017 01/25/2018 03/11/2018   Cholestrol 100 - 199 mg/dL - - - 166 145   LDLCALC 0 - 99 mg/dL - - - 102(H) 75   HDL >39 mg/dL - - - 44 48   Trlycerides 0 - 149 mg/dL - - - 100 111   Hemoglobin A1c 4.8 - 5.6 % - - - - -   PHART 7.350 - 7.450 7.287(L) 7.339(L) - - -   PCO2ART 32.0 - 48.0 mmHg 46.8 42.3 - - -   HCO3 20.0 - 28.0 mmol/L 22.5 22.9 - - -   TCO2 22 - 32 mmol/L 24 24 24  - -   ACIDBASEDEF 0.0 - 2.0 mmol/L 4.0(H) 3.0(H) - - -   O2SAT % 95.0 93.0 - - -      Capillary Blood Glucose: Lab Results  Component Value Date   GLUCAP 138 (H) 12/08/2017   GLUCAP 106 (H) 12/08/2017   GLUCAP 98 12/08/2017   GLUCAP 135 (H) 12/08/2017   GLUCAP 149 (H) 12/07/2017     Exercise Target Goals: Exercise  Program Goal: Individual exercise prescription set using results from initial 6 min walk test and THRR while considering  patient's activity barriers and safety.   Exercise Prescription Goal: Initial exercise prescription builds to 30-45 minutes a day of aerobic activity, 2-3 days per week.  Home exercise guidelines will be given to patient during program as part of exercise prescription that the participant will acknowledge.  Activity Barriers & Risk Stratification: Activity Barriers & Cardiac Risk Stratification - 02/24/18 1115      Activity Barriers & Cardiac Risk Stratification   Activity Barriers  None    Cardiac Risk Stratification  High       6 Minute Walk: 6 Minute Walk    Row Name 02/24/18 1110         6 Minute Walk   Phase  Initial     Distance  1846 feet     Walk Time  6 minutes     # of Rest Breaks  0     MPH  3.5     METS  4.23     RPE  11     Perceived Dyspnea   0     VO2 Peak   14.8     Symptoms  No     Resting HR  58 bpm     Resting BP  122/84     Resting Oxygen Saturation   97 %     Exercise Oxygen Saturation  during 6 min walk  98 %     Max Ex. HR  68 bpm     Max Ex. BP  126/82     2 Minute Post BP  112/68        Oxygen Initial Assessment:   Oxygen Re-Evaluation:   Oxygen Discharge (Final Oxygen Re-Evaluation):   Initial Exercise Prescription: Initial Exercise Prescription - 02/24/18 1100      Date of Initial Exercise RX and Referring Provider   Date  02/24/18    Referring Provider  Glori Bickers MD    Expected Discharge Date  06/03/18      Treadmill   MPH  2.3    Grade  1    Minutes  10    METs  3.08      Recumbant Bike   Level  2.5    Watts  40    Minutes  10    METs  3.94      NuStep   Level  3    SPM  75    Minutes  10    METs  3.5      Prescription Details   Frequency (times per week)  3x    Duration  Progress to 30 minutes of continuous aerobic without signs/symptoms of physical distress      Intensity   THRR 40-80% of Max Heartrate  65-130    Ratings of Perceived Exertion  11-13    Perceived Dyspnea  0-4      Progression   Progression  Continue progressive overload as per policy without signs/symptoms or physical distress.      Resistance Training   Training Prescription  Yes    Weight  4lbs    Reps  10-15       Perform Capillary Blood Glucose checks as needed.  Exercise Prescription Changes: Exercise Prescription Changes    Row Name 03/02/18 [phone] 03/07/18 0952 03/21/18 0956 04/04/18 0959       Response to Exercise   Blood Pressure (Admit)  122/70  108/60  112/68  120/80    Blood Pressure (Exercise)  120/60  118/80  118/78  164/90    Blood Pressure (Exit)  102/80  100/60  126/80  108/70    Heart Rate (Admit)  62 bpm  79 bpm  85 bpm  63 bpm    Heart Rate (Exercise)  87 bpm  101 bpm  112 bpm  96 bpm    Heart Rate (Exit)  62 bpm  67 bpm  64 bpm  59 bpm    Rating of Perceived Exertion (Exercise)  12   12  13  13     Symptoms  none  none  none  none    Duration  Progress to 30 minutes of  aerobic without signs/symptoms of physical distress  Progress to 30 minutes of  aerobic without signs/symptoms of physical distress  Progress to 30 minutes of  aerobic without signs/symptoms of physical distress  Progress to 30 minutes of  aerobic without signs/symptoms of physical distress    Intensity  THRR unchanged  THRR unchanged  THRR unchanged  THRR unchanged      Progression   Progression  Continue to progress workloads to maintain intensity without signs/symptoms of physical distress.  Continue to progress workloads to maintain intensity without signs/symptoms of physical distress.  Continue to progress workloads to maintain intensity without signs/symptoms of physical distress.  Continue to progress workloads to maintain intensity without signs/symptoms of physical distress.    Average METs  3.2  3.9  4.2  4.4      Resistance Training   Training Prescription  No Relaxation day, no weights  Yes  Yes  Yes    Weight  -  4lbs  4lbs  4lbs    Reps  -  10-15  10-15  10-15    Time  -  10 Minutes  10 Minutes  10 Minutes      Interval Training   Interval Training  -  No  No  No      Treadmill   MPH  2.3  2.4  2.6  2.7    Grade  1  1  1  1     Minutes  10  10  10  10     METs  3.08  3.17  3.26  3.35      Recumbant Bike   Level  2.5  2.5 Upright SciFit bike  2.5 Upright SciFit bike  2.5 Upright SciFit bike    Minutes  10  10  10  10     METs  3.8  5.1  5.3  5.6      NuStep   Level  3  4  5  5     SPM  75  85  85  85    Minutes  10  10  10  10     METs  2.7  3.4  4  4.3      Home Exercise Plan   Plans to continue exercise at  -  Home (comment) Patient has a treadmill at home.  Home (comment) Patient has a treadmill at home.  Home (comment) Patient has a treadmill at home.    Frequency  -  Add 2 additional days to program exercise sessions.  Add 2 additional days to program exercise sessions.  Add 2  additional days to program exercise sessions.    Initial Home Exercises Provided  -  03/09/18  03/09/18  03/09/18       Exercise  Comments: Exercise Comments    Row Name 03/02/18 1038 03/07/18 1009 03/09/18 1005 03/21/18 1020 04/04/18 1035   Exercise Comments  Patient toleated 1st exercise session well with symptoms.  Reviewed METs with patient.  Reviewed home exericse guidelines, METs, and goals with patient.  Reviewed METs and goals with patient.  METs reviewed with patient.      Exercise Goals and Review: Exercise Goals    Row Name 02/24/18 1115             Exercise Goals   Increase Strength and Stamina  Yes       Intervention  Develop an individualized exercise prescription for aerobic and resistive training based on initial evaluation findings, risk stratification, comorbidities and participant's personal goals.;Provide advice, education, support and counseling about physical activity/exercise needs.       Expected Outcomes  Short Term: Increase workloads from initial exercise prescription for resistance, speed, and METs.;Short Term: Perform resistance training exercises routinely during rehab and add in resistance training at home;Long Term: Improve cardiorespiratory fitness, muscular endurance and strength as measured by increased METs and functional capacity (6MWT)       Able to understand and use rate of perceived exertion (RPE) scale  Yes       Intervention  Provide education and explanation on how to use RPE scale       Expected Outcomes  Short Term: Able to use RPE daily in rehab to express subjective intensity level;Long Term:  Able to use RPE to guide intensity level when exercising independently       Knowledge and understanding of Target Heart Rate Range (THRR)  Yes       Intervention  Provide education and explanation of THRR including how the numbers were predicted and where they are located for reference       Expected Outcomes  Long Term: Able to use THRR to govern  intensity when exercising independently;Short Term: Able to state/look up THRR;Short Term: Able to use daily as guideline for intensity in rehab       Able to check pulse independently  Yes       Intervention  Review the importance of being able to check your own pulse for safety during independent exercise;Provide education and demonstration on how to check pulse in carotid and radial arteries.       Expected Outcomes  Long Term: Able to check pulse independently and accurately;Short Term: Able to explain why pulse checking is important during independent exercise       Understanding of Exercise Prescription  Yes       Intervention  Provide education, explanation, and written materials on patient's individual exercise prescription       Expected Outcomes  Long Term: Able to explain home exercise prescription to exercise independently;Short Term: Able to explain program exercise prescription          Exercise Goals Re-Evaluation : Exercise Goals Re-Evaluation    [place] Name 03/02/18 1038 03/09/18 1005 03/21/18 1020         Exercise Goal Re-Evaluation   Exercise Goals Review  Able to understand and use rate of perceived exertion (RPE) scale;Increase Physical Activity  Able to understand and use rate of perceived exertion (RPE) scale;Increase Physical Activity;Knowledge and understanding of Target Heart Rate Range (THRR);Able to check pulse independently;Understanding of Exercise Prescription  Able to understand and use rate of perceived exertion (RPE) scale;Increase Physical Activity;Knowledge and understanding of Target Heart Rate Range (THRR);Able to check pulse independently;Understanding of Exercise Prescription;Increase  Strength and Stamina     Comments  Patient able to understand and use RPE scale appropriately.   Reviewed home exercise guidelines with patient including THRR, RPE scale, and endpoints for exercise. Pt has a treadmill at home and is walking 30 minutes, 2 days/week. Pt knows how to  count her pulse independently.  Patient is walking at home in addition to exercise at cardiac rehab. Pt's goal is to be able to clean out her house in preparation for [place] the beach. Pt states that she has been able to begin cleaning out her house without difficulty.     Expected Outcomes  Increase workloads as tolerated to help improve cardiorespiratory fitness.  Patient will walk on her treadmill at home, 30 minutes, at least 2 days/week.  Continue to progress workloads to help increase stamina to be able to do ADLs and have energy to clean house.        Discharge Exercise Prescription (Final Exercise Prescription Changes): Exercise Prescription Changes - 04/04/18 0959      Response to Exercise   Blood Pressure (Admit)  120/80    Blood Pressure (Exercise)  164/90    Blood Pressure (Exit)  108/70    Heart Rate (Admit)  63 bpm    Heart Rate (Exercise)  96 bpm    Heart Rate (Exit)  59 bpm    Rating of Perceived Exertion (Exercise)  13    Symptoms  none    Duration  Progress to 30 minutes of  aerobic without signs/symptoms of physical distress    Intensity  THRR unchanged      Progression   Progression  Continue to progress workloads to maintain intensity without signs/symptoms of physical distress.    Average METs  4.4      Resistance Training   Training Prescription  Yes    Weight  4lbs    Reps  10-15    Time  10 Minutes      Interval Training   Interval Training  No      Treadmill   MPH  2.7    Grade  1    Minutes  10    METs  3.35      Recumbant Bike   Level  2.5   Upright SciFit bike   Minutes  10    METs  5.6      NuStep   Level  5    SPM  85    Minutes  10    METs  4.3      Home Exercise Plan   Plans to continue exercise at  Home (comment)   Patient has a treadmill at home.   Frequency  Add 2 additional days to program exercise sessions.    Initial Home Exercises Provided  03/09/18       Nutrition:  Target Goals: Understanding of nutrition  guidelines, daily intake of sodium 1500mg , cholesterol 200mg , calories 30% from fat and 7% or less from saturated fats, daily to have 5 or more servings of fruits and vegetables.  Biometrics: Pre Biometrics - 02/24/18 1113      Pre Biometrics   Height  5' 2.25" (1.581 m)    Weight  64 kg    Waist Circumference  32.5 inches    Hip Circumference  39.5 inches    Waist to Hip Ratio  0.82 %    BMI (Calculated)  25.6    Triceps Skinfold  31 mm    % Body Fat  39.7 %    Grip Strength  27.5 kg    Flexibility  12 in    Single Leg Stand  5 seconds        Nutrition Therapy Plan and Nutrition Goals: Nutrition Therapy & Goals - 03/07/18 1031      Nutrition Therapy   Diet  heart healthy      Personal Nutrition Goals   Nutrition Goal  Pt to identify and limit food sources of saturated fat, trans fat, refined carbohydrates and sodium    Personal Goal #2  Pt to choose desserts carefully.    Personal Goal #3  Pt able to name foods that affect blood glucose.      Intervention Plan   Intervention  Prescribe, educate and counsel regarding individualized specific dietary modifications aiming towards targeted core components such as weight, hypertension, lipid management, diabetes, heart failure and other comorbidities.    Expected Outcomes  Short Term Goal: Understand basic principles of dietary content, such as calories, fat, sodium, cholesterol and nutrients.;Long Term Goal: Adherence to prescribed nutrition plan.       Nutrition Assessments: Nutrition Assessments - 02/24/18 1540      MEDFICTS Scores   Pre Score  15       Nutrition Goals Re-Evaluation:   Nutrition Goals Re-Evaluation:   Nutrition Goals Discharge (Final Nutrition Goals Re-Evaluation):   Psychosocial: Target Goals: Acknowledge presence or absence of significant depression and/or stress, maximize coping skills, provide positive support system. Participant is able to verbalize types and ability to use techniques and  skills needed for reducing stress and depression.  Initial Review & Psychosocial Screening: Initial Psych Review & Screening - 02/24/18 1057      Initial Review   Current issues with  Current Depression      Family Dynamics   Good Support System?  Yes   Pt lists her husband, mother, and daughters as soures of support.      Barriers   Psychosocial barriers to participate in program  The patient should benefit from training in stress management and relaxation.      Screening Interventions   Interventions  Encouraged to exercise;Other (comment)   Pt recently started on medications for depression and reports that this has helped.        Quality of Life Scores: Quality of Life - 02/24/18 1054      Quality of Life   Select  Quality of Life      Quality of Life Scores   Health/Function Pre  25.53 %    Socioeconomic Pre  20.06 %    Psych/Spiritual Pre  24.92 %    Family Pre  30 %    GLOBAL Pre  24.79 %      Scores of 19 and below usually indicate a poorer quality of life in these areas.  A difference of  2-3 points is a clinically meaningful difference.  A difference of 2-3 points in the total score of the Quality of Life Index has been associated with significant improvement in overall quality of life, self-image, physical symptoms, and general health in studies assessing change in quality of life.  PHQ-9: Recent Review Flowsheet Data    Depression screen [facility] 2/9 03/02/2018   Decreased Interest 0   Down, Depressed, Hopeless 0   PHQ - 2 Score 0     Interpretation of Total Score  Total Score Depression Severity:  1-4 = Minimal depression, 5-9 = Mild depression, 10-14 = Moderate depression, 15-19 = Moderately severe  depression, 20-27 = Severe depression   Psychosocial Evaluation and Intervention:   Psychosocial Re-Evaluation: Psychosocial Re-Evaluation    [place] Name 03/17/18 1055 04/14/18 1525           Psychosocial Re-Evaluation   Current issues with  Current  Depression  Current Depression      Comments  Fraser Din has not voiced any increased stressors or increase in depression  Fraser Din has not voiced any increased stressors or increase in depression      Expected Outcomes  Will continue to offer emotional support as needed.  Will continue to offer emotional support as needed.      Interventions  Encouraged to attend Cardiac Rehabilitation for the exercise;Stress management education  Encouraged to attend Cardiac Rehabilitation for the exercise;Stress management education      Continue Psychosocial Services   No Follow up required  No Follow up required         Psychosocial Discharge (Final Psychosocial Re-Evaluation): Psychosocial Re-Evaluation - 04/14/18 1525      Psychosocial Re-Evaluation   Current issues with  Current Depression    Comments  Fraser Din has not voiced any increased stressors or increase in depression    Expected Outcomes  Will continue to offer emotional support as needed.    Interventions  Encouraged to attend Cardiac Rehabilitation for the exercise;Stress management education    Continue Psychosocial Services   No Follow up required       Vocational Rehabilitation: Provide vocational rehab assistance to qualifying candidates.   Vocational Rehab Evaluation & Intervention: Vocational Rehab - 02/24/18 1053      Initial Vocational Rehab Evaluation & Intervention   Assessment shows need for Vocational Rehabilitation  No       Education: Education Goals: Education classes will be provided on a weekly basis, covering required topics. Participant will state understanding/return demonstration of topics presented.  Learning Barriers/Preferences: Learning Barriers/Preferences - 02/24/18 1129      Learning Barriers/Preferences   Learning Barriers  Sight    Learning Preferences  Written Material       Education Topics: Count Your Pulse:  -Group instruction provided by verbal instruction, demonstration, patient participation and  written materials to support subject.  Instructors address importance of being able to find your pulse and how to count your pulse when at home without a heart monitor.  Patients get hands on experience counting their pulse with staff help and individually.   CARDIAC REHAB PHASE II EXERCISE from 03/04/2018 in [place]  Date  03/04/18  Educator  RN  Instruction Review Code  2- Demonstrated Understanding      Heart Attack, Angina, and Risk Factor Modification:  -Group instruction provided by verbal instruction, video, and written materials to support subject.  Instructors address signs and symptoms of angina and heart attacks.    Also discuss risk factors for heart disease and how to make changes to improve heart health risk factors.   Functional Fitness:  -Group instruction provided by verbal instruction, demonstration, patient participation, and written materials to support subject.  Instructors address safety measures for doing things around the house.  Discuss how to get up and down off the floor, how to pick things up properly, how to safely get out of a chair without assistance, and balance training.   Meditation and Mindfulness:  -Group instruction provided by verbal instruction, patient participation, and written materials to support subject.  Instructor addresses importance of mindfulness and meditation practice to help reduce stress and improve  awareness.  Instructor also leads participants through a meditation exercise.    Stretching for Flexibility and Mobility:  -Group instruction provided by verbal instruction, patient participation, and written materials to support subject.  Instructors lead participants through series of stretches that are designed to increase flexibility thus improving mobility.  These stretches are additional exercise for major muscle groups that are typically performed during regular warm up and cool down.   Hands Only CPR:   -Group verbal, video, and participation provides a basic overview of AHA guidelines for community CPR. Role-play of emergencies allow participants the opportunity to practice calling for help and chest compression technique with discussion of AED use.   Hypertension: -Group verbal and written instruction that provides a basic overview of hypertension including the most recent diagnostic guidelines, risk factor reduction with self-care instructions and medication management.    Nutrition I class: Heart Healthy Eating:  -Group instruction provided by PowerPoint slides, verbal discussion, and written materials to support subject matter. The instructor gives an explanation and review of the Therapeutic Lifestyle Changes diet recommendations, which includes a discussion on lipid goals, dietary fat, sodium, fiber, plant stanol/sterol esters, sugar, and the components of a well-balanced, healthy diet.   Nutrition II class: Lifestyle Skills:  -Group instruction provided by PowerPoint slides, verbal discussion, and written materials to support subject matter. The instructor gives an explanation and review of label reading, grocery shopping for heart health, heart healthy recipe modifications, and ways to make healthier choices when eating out.   Diabetes Question & Answer:  -Group instruction provided by PowerPoint slides, verbal discussion, and written materials to support subject matter. The instructor gives an explanation and review of diabetes co-morbidities, pre- and post-prandial blood glucose goals, pre-exercise blood glucose goals, signs, symptoms, and treatment of hypoglycemia and hyperglycemia, and foot care basics.   Diabetes Blitz:  -Group instruction provided by PowerPoint slides, verbal discussion, and written materials to support subject matter. The instructor gives an explanation and review of the physiology behind type 1 and type 2 diabetes, diabetes medications and rational behind  using different medications, pre- and post-prandial blood glucose recommendations and Hemoglobin A1c goals, diabetes diet, and exercise including blood glucose guidelines for exercising safely.    Portion Distortion:  -Group instruction provided by PowerPoint slides, verbal discussion, written materials, and food models to support subject matter. The instructor gives an explanation of serving size versus portion size, changes in portions sizes over the last 20 years, and what consists of a serving from each food group.   Stress Management:  -Group instruction provided by verbal instruction, video, and written materials to support subject matter.  Instructors review role of stress in heart disease and how to cope with stress positively.     CARDIAC REHAB PHASE II EXERCISE from 03/23/2018 in [place]  Date  03/09/18  Educator  RN  Instruction Review Code  2- Demonstrated Understanding      Exercising on Your Own:  -Group instruction provided by verbal instruction, power point, and written materials to support subject.  Instructors discuss benefits of exercise, components of exercise, frequency and intensity of exercise, and end points for exercise.  Also discuss use of nitroglycerin and activating EMS.  Review options of places to exercise outside of rehab.  Review guidelines for sex with heart disease.   Cardiac Drugs I:  -Group instruction provided by verbal instruction and written materials to support subject.  Instructor reviews cardiac drug classes: antiplatelets, anticoagulants, beta blockers, and statins.  Instructor discusses reasons, side effects, and lifestyle considerations for each drug class.   Cardiac Drugs II:  -Group instruction provided by verbal instruction and written materials to support subject.  Instructor reviews cardiac drug classes: angiotensin converting enzyme inhibitors (ACE-I), angiotensin II receptor blockers (ARBs), nitrates, and  calcium channel blockers.  Instructor discusses reasons, side effects, and lifestyle considerations for each drug class.   CARDIAC REHAB PHASE II EXERCISE from 03/23/2018 in [facility]  Date  03/23/18  Instruction Review Code  2- Demonstrated Understanding      Anatomy and Physiology of the Circulatory System:  Group verbal and written instruction and models provide basic cardiac anatomy and physiology, with the coronary electrical and arterial systems. Review of: AMI, Angina, Valve disease, Heart Failure, Peripheral Artery Disease, Cardiac Arrhythmia, Pacemakers, and the ICD.   CARDIAC REHAB PHASE II EXERCISE from 03/23/2018 in [facility]  Date  03/16/18  Educator  RN  Instruction Review Code  2- Demonstrated Understanding      Other Education:  -Group or individual verbal, written, or video instructions that support the educational goals of the cardiac rehab program.   Holiday Eating Survival Tips:  -Group instruction provided by PowerPoint slides, verbal discussion, and written materials to support subject matter. The instructor gives patients tips, tricks, and techniques to help them not only survive but enjoy the holidays despite the onslaught of food that accompanies the holidays.   Knowledge Questionnaire Score: Knowledge Questionnaire Score - 02/24/18 1053      Knowledge Questionnaire Score   Pre Score  21/24       Core Components/Risk Factors/Patient Goals at Admission: Personal Goals and Risk Factors at Admission - 02/24/18 1129      Core Components/Risk Factors/Patient Goals on Admission    Weight Management  Yes    Intervention  Weight Management: Develop a combined nutrition and exercise program designed to reach desired caloric intake, while maintaining appropriate intake of nutrient and fiber, sodium and fats, and appropriate energy expenditure required for the weight goal.;Weight Management: Provide  education and appropriate resources to help participant work on and attain dietary goals.;Weight Management/Obesity: Establish reasonable short term and long term weight goals.    Admit Weight  141 lb 1.5 oz (64 kg)    Goal Weight: Long Term  135 lb (61.2 kg)    Expected Outcomes  Short Term: Continue to assess and modify interventions until short term weight is achieved;Long Term: Adherence to nutrition and physical activity/exercise program aimed toward attainment of established weight goal;Weight Loss: Understanding of general recommendations for a balanced deficit meal plan, which promotes 1-2 lb weight loss per week and includes a negative energy balance of [phone] kcal/d;Weight Maintenance: Understanding of the daily nutrition guidelines, which includes 25-35% calories from fat, 7% or less cal from saturated fats, less than 200mg  cholesterol, less than 1.5gm of sodium, & 5 or more servings of fruits and vegetables daily;Understanding recommendations for meals to include 15-35% energy as protein, 25-35% energy from fat, 35-60% energy from carbohydrates, less than 200mg  of dietary cholesterol, 20-35 gm of total fiber daily;Understanding of distribution of calorie intake throughout the day with the consumption of 4-5 meals/snacks    Hypertension  Yes    Intervention  Provide education on lifestyle modifcations including regular physical activity/exercise, weight management, moderate sodium restriction and increased consumption of fresh fruit, vegetables, and low fat dairy, alcohol moderation, and smoking cessation.;Monitor prescription use compliance.    Expected Outcomes  Short Term: Continued assessment and intervention until BP is < 140/72mm HG in hypertensive participants. < 130/7mm HG in hypertensive participants with diabetes, heart failure or chronic kidney disease.;Long Term: Maintenance of blood pressure at goal levels.    Lipids  Yes    Intervention  Provide education and support for  participant on nutrition & aerobic/resistive exercise along with prescribed medications to achieve LDL 70mg , HDL >40mg .    Expected Outcomes  Short Term: Participant states understanding of desired cholesterol values and is compliant with medications prescribed. Participant is following exercise prescription and nutrition guidelines.;Long Term: Cholesterol controlled with medications as prescribed, with individualized exercise RX and with personalized nutrition plan. Value goals: LDL < 70mg , HDL > 40 mg.       Core Components/Risk Factors/Patient Goals Review:  Goals and Risk Factor Review    Row Name 03/17/18 1058 04/14/18 1525           Core Components/Risk Factors/Patient Goals Review   Personal Goals Review  Weight Management/Obesity;Lipids;Hypertension  Weight Management/Obesity;Lipids;Hypertension      Review  Pat's vital signs have been stable. Fraser Din has been doing well with exercise.  Pat's vital signs have been stable. Fraser Din has been doing well with exercise.      Expected Outcomes  Patient will continue to participate in phase 2 cardiac rehab for exercise, nutrtion and lifestyle modifications.  Patient will continue to participate in phase 2 cardiac rehab for exercise, nutrtion and lifestyle modifications.         Core Components/Risk Factors/Patient Goals at Discharge (Final Review):  Goals and Risk Factor Review - 04/14/18 1525      Core Components/Risk Factors/Patient Goals Review   Personal Goals Review  Weight Management/Obesity;Lipids;Hypertension    Review  Pat's vital signs have been stable. Fraser Din has been doing well with exercise.    Expected Outcomes  Patient will continue to participate in phase 2 cardiac rehab for exercise, nutrtion and lifestyle modifications.       ITP Comments: ITP Comments    Row Name 02/24/18 1110 03/15/18 1557 04/14/18 1524       ITP Comments  Dr. Fransico Him, Medical Director   30 Day ITP Review. Fraser Din is off to a good start to exercise  30  Day ITP Review. Patient is with good participation and attendance in phase 2 cardiac rehab        Comments: See ITP comments.Barnet Pall, RN,BSN 04/14/2018 3:31 PM

## 2018-04-15 ENCOUNTER — Ambulatory Visit (HOSPITAL_COMMUNITY): Payer: Commercial Managed Care - PPO

## 2018-04-15 ENCOUNTER — Encounter (HOSPITAL_COMMUNITY): Payer: Managed Care, Other (non HMO)

## 2018-04-18 ENCOUNTER — Ambulatory Visit (HOSPITAL_COMMUNITY): Payer: Commercial Managed Care - PPO

## 2018-04-18 ENCOUNTER — Encounter (HOSPITAL_COMMUNITY): Payer: Managed Care, Other (non HMO)

## 2018-04-20 ENCOUNTER — Encounter (HOSPITAL_COMMUNITY): Payer: Managed Care, Other (non HMO)

## 2018-04-20 ENCOUNTER — Ambulatory Visit (HOSPITAL_COMMUNITY): Payer: Commercial Managed Care - PPO

## 2018-04-22 ENCOUNTER — Encounter (HOSPITAL_COMMUNITY): Payer: Managed Care, Other (non HMO)

## 2018-04-22 ENCOUNTER — Ambulatory Visit (HOSPITAL_COMMUNITY): Payer: Commercial Managed Care - PPO

## 2018-04-25 ENCOUNTER — Encounter (HOSPITAL_COMMUNITY): Payer: Managed Care, Other (non HMO)

## 2018-04-25 ENCOUNTER — Telehealth (HOSPITAL_COMMUNITY): Payer: Self-pay | Admitting: *Deleted

## 2018-04-25 ENCOUNTER — Ambulatory Visit (HOSPITAL_COMMUNITY): Payer: Commercial Managed Care - PPO

## 2018-04-25 NOTE — Telephone Encounter (Signed)
Pt called to update about closure of Cardiac Rehab for two weeks. Pt verbalized understanding and appreciated call.

## 2018-04-27 ENCOUNTER — Ambulatory Visit (HOSPITAL_COMMUNITY): Payer: Commercial Managed Care - PPO

## 2018-04-27 ENCOUNTER — Encounter (HOSPITAL_COMMUNITY): Payer: Managed Care, Other (non HMO)

## 2018-04-28 ENCOUNTER — Encounter (HOSPITAL_COMMUNITY): Payer: Self-pay | Admitting: *Deleted

## 2018-04-28 DIAGNOSIS — Z951 Presence of aortocoronary bypass graft: Secondary | ICD-10-CM

## 2018-04-29 ENCOUNTER — Encounter (HOSPITAL_COMMUNITY): Payer: Managed Care, Other (non HMO)

## 2018-04-29 ENCOUNTER — Ambulatory Visit (HOSPITAL_COMMUNITY): Payer: Commercial Managed Care - PPO

## 2018-05-02 ENCOUNTER — Ambulatory Visit (HOSPITAL_COMMUNITY): Payer: Commercial Managed Care - PPO

## 2018-05-02 ENCOUNTER — Encounter (HOSPITAL_COMMUNITY): Payer: Managed Care, Other (non HMO)

## 2018-05-03 ENCOUNTER — Encounter (HOSPITAL_COMMUNITY): Payer: Self-pay | Admitting: *Deleted

## 2018-05-03 ENCOUNTER — Telehealth (HOSPITAL_COMMUNITY): Payer: Self-pay | Admitting: *Deleted

## 2018-05-03 DIAGNOSIS — Z951 Presence of aortocoronary bypass graft: Secondary | ICD-10-CM

## 2018-05-03 NOTE — Progress Notes (Signed)
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  aspirin 81 MG tablet, Take 1 tablet (81 mg total) by mouth daily., Disp: , Rfl:  .  atorvastatin (LIPITOR) 80 MG tablet, Take 1 tablet (80 mg total) by mouth daily., Disp: 90 tablet, Rfl: 3 .  calcium carbonate (OS-CAL - DOSED IN MG OF ELEMENTAL CALCIUM) 1250 (500 Ca) MG tablet, Take 1 tablet by mouth daily with breakfast., Disp: , Rfl:  .  clopidogrel (PLAVIX) 75 MG tablet, Take 1 tablet (75 mg total) by mouth daily., Disp: 90 tablet, Rfl: 2 .  DULoxetine (CYMBALTA) 60 MG capsule, TAKE ONE CAPSULE BY MOUTH DAILY, Disp: 30 capsule, Rfl: 2 .  famotidine (PEPCID) 40 MG tablet, Take 40 mg by mouth at bedtime. , Disp: , Rfl:  .  lisinopril (PRINIVIL,ZESTRIL) 2.5 MG tablet, Take 1 tablet (2.5 mg total) by mouth daily., Disp: 90 tablet, Rfl: 2 .  metoprolol tartrate (LOPRESSOR) 25 MG tablet, Take 1 tablet (25 mg total) by mouth 2 (two) times daily., Disp: 180 tablet, Rfl: 2 .  Multiple Vitamin (MULTIVITAMIN) tablet, Take 1 tablet by mouth daily., Disp: , Rfl:  .  nitroGLYCERIN (NITROSTAT) 0.4 MG SL tablet, Place 1 tablet (0.4 mg total) under the tongue every 5 (five) minutes as needed., Disp: 25 tablet, Rfl: 3  Past Medical History: Past Medical History:  Diagnosis Date  . Acid reflux   . Coronary artery disease   . Fibroid   . Hyperlipidemia   . Hypertension   . Osteopenia 10/2016   T score -2.1 FRAX's 5.7%/0.3%    Tobacco Use: Social  History   Tobacco Use  Smoking Status Former Smoker  . Last attempt to quit: 09/08/1994  . Years since quitting: 23.6  Smokeless Tobacco Never Used    Labs: Recent Review Flowsheet Data    Labs for ITP Cardiac and Pulmonary Rehab Latest Ref Rng & Units 12/08/2017 12/08/2017 12/08/2017 01/25/2018 03/11/2018   Cholestrol 100 - 199 mg/dL - - - 166 145   LDLCALC 0 - 99 mg/dL - - - 102(H) 75   HDL >39 mg/dL - - - 44 48   Trlycerides 0 - 149 mg/dL - - - 100 111   Hemoglobin A1c 4.8 - 5.6 % - - - - -   PHART 7.350 - 7.450 7.287(L) 7.339(L) - - -   PCO2ART 32.0 - 48.0 mmHg 46.8 42.3 - - -   HCO3 20.0 - 28.0 mmol/L 22.5 22.9 - - -   TCO2 22 - 32 mmol/L 24 24 24  - -   ACIDBASEDEF 0.0 - 2.0 mmol/L 4.0(H) 3.0(H) - - -   O2SAT % 95.0 93.0 - - -      Capillary Blood Glucose: Lab Results  Component Value Date   GLUCAP 138 (H) 12/08/2017   GLUCAP 106 (H) 12/08/2017   GLUCAP 98 12/08/2017   GLUCAP 135 (H) 12/08/2017   GLUCAP 149 (H) 12/07/2017     Exercise Target Goals: Exercise  Program Goal: Individual exercise prescription set using results from initial 6 min walk test and THRR while considering  patient's activity barriers and safety.   Exercise Prescription Goal: Initial exercise prescription builds to 30-45 minutes a day of aerobic activity, 2-3 days per week.  Home exercise guidelines will be given to patient during program as part of exercise prescription that the participant will acknowledge.  Activity Barriers & Risk Stratification:   6 Minute Walk:   Oxygen Initial Assessment:   Oxygen Re-Evaluation:   Oxygen Discharge (Final Oxygen Re-Evaluation):   Initial Exercise Prescription:   Perform Capillary Blood Glucose checks as needed.  Exercise Prescription Changes: Exercise Prescription Changes    Row Name 03/07/18 [phone] 03/21/18 0956 04/04/18 0959 04/13/18 1000       Response to Exercise   Blood Pressure (Admit)  108/60  112/68  120/80  124/72    Blood  Pressure (Exercise)  118/80  118/78  164/90  158/80    Blood Pressure (Exit)  100/60  126/80  108/70  108/80    Heart Rate (Admit)  79 bpm  85 bpm  63 bpm  84 bpm    Heart Rate (Exercise)  101 bpm  112 bpm  96 bpm  127 bpm    Heart Rate (Exit)  67 bpm  64 bpm  59 bpm  84 bpm    Rating of Perceived Exertion (Exercise)  12  13  13  13     Symptoms  none  none  none  none    Duration  Progress to 30 minutes of  aerobic without signs/symptoms of physical distress  Progress to 30 minutes of  aerobic without signs/symptoms of physical distress  Progress to 30 minutes of  aerobic without signs/symptoms of physical distress  Progress to 30 minutes of  aerobic without signs/symptoms of physical distress    Intensity  THRR unchanged  THRR unchanged  THRR unchanged  THRR unchanged      Progression   Progression  Continue to progress workloads to maintain intensity without signs/symptoms of physical distress.  Continue to progress workloads to maintain intensity without signs/symptoms of physical distress.  Continue to progress workloads to maintain intensity without signs/symptoms of physical distress.  Continue to progress workloads to maintain intensity without signs/symptoms of physical distress.    Average METs  3.9  4.2  4.4  4.7      Resistance Training   Training Prescription  Yes  Yes  Yes  No Relaxation day, no weights.    Weight  4lbs  4lbs  4lbs  -    Reps  10-15  10-15  10-15  -    Time  10 Minutes  10 Minutes  10 Minutes  -      Interval Training   Interval Training  No  No  No  No      Treadmill   MPH  2.4  2.6  2.7  2.8    Grade  1  1  1  1     Minutes  10  10  10  10     METs  3.17  3.26  3.35  3.53      Recumbant Bike   Level  2.5 Upright [employer] bike  2.5 Upright [employer] bike  2.5 Upright [employer] bike  -    Minutes  10  10  10   -    METs  5.1  5.3  5.6  -      NuStep   Level  4  5  5  5     SPM  85  85  85  85    Minutes  10  10  10  10     METs  3.4  4  4.3  4.4      Rower    Level  -  -  -  5    Watts  -  -  -  50    Minutes  -  -  -  10    METs  -  -  -  6.1      Home Exercise Plan   Plans to continue exercise at  Home (comment) Patient has a treadmill at home.  Home (comment) Patient has a treadmill at home.  Home (comment) Patient has a treadmill at home.  Home (comment) Patient has a treadmill at home.    Frequency  Add 2 additional days to program exercise sessions.  Add 2 additional days to program exercise sessions.  Add 2 additional days to program exercise sessions.  Add 2 additional days to program exercise sessions.    Initial Home Exercises Provided  03/09/18  03/09/18  03/09/18  03/09/18       Exercise Comments: Exercise Comments    Row Name 03/07/18 1009 03/09/18 1005 03/21/18 1020 04/04/18 1035     Exercise Comments  Reviewed METs with patient.  Reviewed home exericse guidelines, METs, and goals with patient.  Reviewed METs and goals with patient.  METs reviewed with patient.       Exercise Goals and Review:   Exercise Goals Re-Evaluation : Exercise Goals Re-Evaluation    Row Name 03/09/18 1005 03/21/18 1020 04/27/18 1158         Exercise Goal Re-Evaluation   Exercise Goals Review  Able to understand and use rate of perceived exertion (RPE) scale;Increase Physical Activity;Knowledge and understanding of Target Heart Rate Range (THRR);Able to check pulse independently;Understanding of Exercise Prescription  Able to understand and use rate of perceived exertion (RPE) scale;Increase Physical Activity;Knowledge and understanding of Target Heart Rate Range (THRR);Able to check pulse independently;Understanding of Exercise Prescription;Increase Strength and Stamina  -     Comments  Reviewed home exercise guidelines with patient including THRR, RPE scale, and endpoints for exercise. Pt has a treadmill at home and is walking 30 minutes, 2 days/week. Pt knows how to count her pulse independently.  Patient is walking at home in addition to exercise at  cardiac rehab. Pt's goal is to be able to clean out her [place] in [place]. Pt states that she has been able to begin cleaning out her house without difficulty.  Temporary department closure due to COVID-19.     Expected Outcomes  Patient will walk on her treadmill at home, 30 minutes, at least 2 days/week.  Continue to progress workloads to help increase stamina to be able to do ADLs and have energy to clean house.  -        Discharge Exercise Prescription (Final Exercise Prescription Changes): Exercise Prescription Changes - 04/13/18 1000      Response to Exercise   Blood Pressure (Admit)  124/72    Blood Pressure (Exercise)  158/80    Blood Pressure (Exit)  108/80    Heart Rate (Admit)  84 bpm    Heart Rate (Exercise)  127 bpm    Heart Rate (Exit)  84 bpm    Rating of Perceived Exertion (Exercise)  13    Symptoms  none    Duration  Progress to 30 minutes of  aerobic without signs/symptoms of physical distress    Intensity  THRR unchanged      Progression   Progression  Continue to progress workloads to maintain intensity without signs/symptoms of physical distress.    Average METs  4.7      Resistance Training   Training Prescription  No   Relaxation day, no weights.     Interval Training   Interval Training  No      Treadmill   MPH  2.8    Grade  1    Minutes  10    METs  3.53      Recumbant Bike   Level  --    Minutes  --    METs  --      NuStep   Level  5    SPM  85    Minutes  10    METs  4.4      Rower   Level  5    Watts  50    Minutes  10    METs  6.1      Home Exercise Plan   Plans to continue exercise at  Home (comment)   Patient has a treadmill at home.   Frequency  Add 2 additional days to program exercise sessions.    Initial Home Exercises Provided  03/09/18       Nutrition:  Target Goals: Understanding of nutrition guidelines, daily intake of sodium 1500mg , cholesterol 200mg , calories 30% from fat and 7% or less  from saturated fats, daily to have 5 or more servings of fruits and vegetables.  Biometrics:    Nutrition Therapy Plan and Nutrition Goals: Nutrition Therapy & Goals - 03/07/18 1031      Nutrition Therapy   Diet  heart healthy      Personal Nutrition Goals   Nutrition Goal  Pt to identify and limit food sources of saturated fat, trans fat, refined carbohydrates and sodium    Personal Goal #2  Pt to choose desserts carefully.    Personal Goal #3  Pt able to name foods that affect blood glucose.      Intervention Plan   Intervention  Prescribe, educate and counsel regarding individualized specific dietary modifications aiming towards targeted core components such as weight, hypertension, lipid management, diabetes, heart failure and other comorbidities.    Expected Outcomes  Short Term Goal: Understand basic principles of dietary content, such as calories, fat, sodium, cholesterol and nutrients.;Long Term Goal: Adherence to prescribed nutrition plan.       Nutrition Assessments:   Nutrition Goals Re-Evaluation:   Nutrition Goals Re-Evaluation:   Nutrition Goals Discharge (Final Nutrition Goals Re-Evaluation):   Psychosocial: Target Goals: Acknowledge presence or absence of significant depression and/or stress, maximize coping skills, provide positive support system. Participant is able to verbalize types and ability to use techniques and skills needed for reducing stress and depression.  Initial Review & Psychosocial Screening:   Quality of Life Scores:  Scores of 19 and below usually indicate a poorer quality of life in these areas.  A difference of  2-3 points is a clinically meaningful difference.  A difference of 2-3 points in the total score of the Quality of Life Index has been associated with significant improvement in overall quality of life, self-image, physical symptoms, and general health in studies assessing change in quality of life.  PHQ-9: Recent Review  Flowsheet Data  Depression screen [facility] 2/9 03/02/2018   Decreased Interest 0   Down, Depressed, Hopeless 0   PHQ - 2 Score 0     Interpretation of Total Score  Total Score Depression Severity:  1-4 = Minimal depression, 5-9 = Mild depression, 10-14 = Moderate depression, 15-19 = Moderately severe depression, 20-27 = Severe depression   Psychosocial Evaluation and Intervention:   Psychosocial Re-Evaluation: Psychosocial Re-Evaluation    Redbird Smith Name 03/17/18 1055 04/14/18 1525 04/28/18 1549         Psychosocial Re-Evaluation   Current issues with  Current Depression  Current Depression  Current Depression     Comments  Fraser Din has not voiced any increased stressors or increase in depression  Fraser Din has not voiced any increased stressors or increase in depression  Unable to assess as exercise is currently on hold     Expected Outcomes  Will continue to offer emotional support as needed.  Will continue to offer emotional support as needed.  -     Interventions  Encouraged to attend Cardiac Rehabilitation for the exercise;Stress management education  Encouraged to attend Cardiac Rehabilitation for the exercise;Stress management education  -     Continue Psychosocial Services   No Follow up required  No Follow up required  -        Psychosocial Discharge (Final Psychosocial Re-Evaluation): Psychosocial Re-Evaluation - 04/28/18 1549      Psychosocial Re-Evaluation   Current issues with  Current Depression    Comments  Unable to assess as exercise is currently on hold       Vocational Rehabilitation: Provide vocational rehab assistance to qualifying candidates.   Vocational Rehab Evaluation & Intervention:   Education: Education Goals: Education classes will be provided on a weekly basis, covering required topics. Participant will state understanding/return demonstration of topics presented.  Learning Barriers/Preferences:   Education Topics: Count Your Pulse:  -Group instruction  provided by verbal instruction, demonstration, patient participation and written materials to support subject.  Instructors address importance of being able to find your pulse and how to count your pulse when at home without a heart monitor.  Patients get hands on experience counting their pulse with staff help and individually.   CARDIAC REHAB PHASE II EXERCISE from 03/04/2018 in [place]  Date  03/04/18  Educator  RN  Instruction Review Code  2- Demonstrated Understanding      Heart Attack, Angina, and Risk Factor Modification:  -Group instruction provided by verbal instruction, video, and written materials to support subject.  Instructors address signs and symptoms of angina and heart attacks.    Also discuss risk factors for heart disease and how to make changes to improve heart health risk factors.   Functional Fitness:  -Group instruction provided by verbal instruction, demonstration, patient participation, and written materials to support subject.  Instructors address safety measures for doing things around the house.  Discuss how to get up and down off the floor, how to pick things up properly, how to safely get out of a chair without assistance, and balance training.   Meditation and Mindfulness:  -Group instruction provided by verbal instruction, patient participation, and written materials to support subject.  Instructor addresses importance of mindfulness and meditation practice to help reduce stress and improve awareness.  Instructor also leads participants through a meditation exercise.    Stretching for Flexibility and Mobility:  -Group instruction provided by verbal instruction, patient participation, and written materials to support subject.  Instructors lead participants  through series of stretches that are designed to increase flexibility thus improving mobility.  These stretches are additional exercise for major muscle groups that are  typically performed during regular warm up and cool down.   Hands Only CPR:  -Group verbal, video, and participation provides a basic overview of AHA guidelines for community CPR. Role-play of emergencies allow participants the opportunity to practice calling for help and chest compression technique with discussion of AED use.   Hypertension: -Group verbal and written instruction that provides a basic overview of hypertension including the most recent diagnostic guidelines, risk factor reduction with self-care instructions and medication management.    Nutrition I class: Heart Healthy Eating:  -Group instruction provided by PowerPoint slides, verbal discussion, and written materials to support subject matter. The instructor gives an explanation and review of the Therapeutic Lifestyle Changes diet recommendations, which includes a discussion on lipid goals, dietary fat, sodium, fiber, plant stanol/sterol esters, sugar, and the components of a well-balanced, healthy diet.   Nutrition II class: Lifestyle Skills:  -Group instruction provided by PowerPoint slides, verbal discussion, and written materials to support subject matter. The instructor gives an explanation and review of label reading, grocery shopping for heart health, heart healthy recipe modifications, and ways to make healthier choices when eating out.   Diabetes Question & Answer:  -Group instruction provided by PowerPoint slides, verbal discussion, and written materials to support subject matter. The instructor gives an explanation and review of diabetes co-morbidities, pre- and post-prandial blood glucose goals, pre-exercise blood glucose goals, signs, symptoms, and treatment of hypoglycemia and hyperglycemia, and foot care basics.   Diabetes Blitz:  -Group instruction provided by PowerPoint slides, verbal discussion, and written materials to support subject matter. The instructor gives an explanation and review of the physiology  behind type 1 and type 2 diabetes, diabetes medications and rational behind using different medications, pre- and post-prandial blood glucose recommendations and Hemoglobin A1c goals, diabetes diet, and exercise including blood glucose guidelines for exercising safely.    Portion Distortion:  -Group instruction provided by PowerPoint slides, verbal discussion, written materials, and food models to support subject matter. The instructor gives an explanation of serving size versus portion size, changes in portions sizes over the last 20 years, and what consists of a serving from each food group.   Stress Management:  -Group instruction provided by verbal instruction, video, and written materials to support subject matter.  Instructors review role of stress in heart disease and how to cope with stress positively.     CARDIAC REHAB PHASE II EXERCISE from 03/23/2018 in [place]  Date  03/09/18  Educator  RN  Instruction Review Code  2- Demonstrated Understanding      Exercising on Your Own:  -Group instruction provided by verbal instruction, power point, and written materials to support subject.  Instructors discuss benefits of exercise, components of exercise, frequency and intensity of exercise, and end points for exercise.  Also discuss use of nitroglycerin and activating EMS.  Review options of places to exercise outside of rehab.  Review guidelines for sex with heart disease.   Cardiac Drugs I:  -Group instruction provided by verbal instruction and written materials to support subject.  Instructor reviews cardiac drug classes: antiplatelets, anticoagulants, beta blockers, and statins.  Instructor discusses reasons, side effects, and lifestyle considerations for each drug class.   Cardiac Drugs II:  -Group instruction provided by verbal instruction and written materials to support subject.  Instructor reviews cardiac drug classes:  angiotensin converting enzyme  inhibitors (ACE-I), angiotensin II receptor blockers (ARBs), nitrates, and calcium channel blockers.  Instructor discusses reasons, side effects, and lifestyle considerations for each drug class.   CARDIAC REHAB PHASE II EXERCISE from 03/23/2018 in [place]  Date  03/23/18  Instruction Review Code  2- Demonstrated Understanding      Anatomy and Physiology of the Circulatory System:  Group verbal and written instruction and models provide basic cardiac anatomy and physiology, with the coronary electrical and arterial systems. Review of: AMI, Angina, Valve disease, Heart Failure, Peripheral Artery Disease, Cardiac Arrhythmia, Pacemakers, and the ICD.   CARDIAC REHAB PHASE II EXERCISE from 03/23/2018 in [place]  Date  03/16/18  Educator  RN  Instruction Review Code  2- Demonstrated Understanding      Other Education:  -Group or individual verbal, written, or video instructions that support the educational goals of the cardiac rehab program.   Holiday Eating Survival Tips:  -Group instruction provided by PowerPoint slides, verbal discussion, and written materials to support subject matter. The instructor gives patients tips, tricks, and techniques to help them not only survive but enjoy the holidays despite the onslaught of food that accompanies the holidays.   Knowledge Questionnaire Score:   Core Components/Risk Factors/Patient Goals at Admission:   Core Components/Risk Factors/Patient Goals Review:  Goals and Risk Factor Review    Row Name 03/17/18 1058 04/14/18 1525 04/28/18 1554         Core Components/Risk Factors/Patient Goals Review   Personal Goals Review  Weight Management/Obesity;Lipids;Hypertension  Weight Management/Obesity;Lipids;Hypertension  Weight Management/Obesity;Lipids;Hypertension     Review  Pat's vital signs have been stable. Fraser Din has been doing well with exercise.  Pat's vital signs have been  stable. Fraser Din has been doing well with exercise.   Exercise is currently on hold per recommended guidelines from the federal government to prevent the spread of COVID-19     Expected Outcomes  Patient will continue to participate in phase 2 cardiac rehab for exercise, nutrtion and lifestyle modifications.  Patient will continue to participate in phase 2 cardiac rehab for exercise, nutrtion and lifestyle modifications.  Patient will continue to participate in phase 2 cardiac rehab for exercise, nutrtion and lifestyle modifications once exercise resumes        Core Components/Risk Factors/Patient Goals at Discharge (Final Review):  Goals and Risk Factor Review - 04/28/18 1554      Core Components/Risk Factors/Patient Goals Review   Personal Goals Review  Weight Management/Obesity;Lipids;Hypertension    Review   Exercise is currently on hold per recommended guidelines from the federal government to prevent the spread of COVID-19    Expected Outcomes  Patient will continue to participate in phase 2 cardiac rehab for exercise, nutrtion and lifestyle modifications once exercise resumes       ITP Comments: ITP Comments    Row Name 03/15/18 1557 04/14/18 1524 04/28/18 1548       ITP Comments  30 Day ITP Review. Fraser Din is off to a good start to exercise  30 Day ITP Review. Patient is with good participation and attendance in phase 2 cardiac rehab  30 Day ITP Review.  Exercise is currently on hold per recommended guidelines from the federal government to prevent the spread of COVID-19        Comments: See ITP comments.Barnet Pall, RN,BSN 05/03/2018 11:59 AM

## 2018-05-04 ENCOUNTER — Encounter (HOSPITAL_COMMUNITY): Payer: Managed Care, Other (non HMO)

## 2018-05-04 ENCOUNTER — Ambulatory Visit (HOSPITAL_COMMUNITY): Payer: Commercial Managed Care - PPO

## 2018-05-06 ENCOUNTER — Encounter (HOSPITAL_COMMUNITY): Payer: Managed Care, Other (non HMO)

## 2018-05-06 ENCOUNTER — Ambulatory Visit (HOSPITAL_COMMUNITY): Payer: Commercial Managed Care - PPO

## 2018-05-09 ENCOUNTER — Ambulatory Visit (HOSPITAL_COMMUNITY): Payer: Commercial Managed Care - PPO

## 2018-05-09 ENCOUNTER — Encounter (HOSPITAL_COMMUNITY): Payer: Managed Care, Other (non HMO)

## 2018-05-11 ENCOUNTER — Ambulatory Visit (HOSPITAL_COMMUNITY): Payer: Commercial Managed Care - PPO

## 2018-05-11 ENCOUNTER — Encounter (HOSPITAL_COMMUNITY): Payer: Managed Care, Other (non HMO)

## 2018-05-13 ENCOUNTER — Encounter (HOSPITAL_COMMUNITY): Payer: Managed Care, Other (non HMO)

## 2018-05-13 ENCOUNTER — Ambulatory Visit (HOSPITAL_COMMUNITY): Payer: Commercial Managed Care - PPO

## 2018-05-16 ENCOUNTER — Encounter (HOSPITAL_COMMUNITY): Payer: Managed Care, Other (non HMO)

## 2018-05-16 ENCOUNTER — Ambulatory Visit (HOSPITAL_COMMUNITY): Payer: Commercial Managed Care - PPO

## 2018-05-18 ENCOUNTER — Encounter (HOSPITAL_COMMUNITY): Payer: Managed Care, Other (non HMO)

## 2018-05-18 ENCOUNTER — Telehealth (HOSPITAL_COMMUNITY): Payer: Self-pay | Admitting: *Deleted

## 2018-05-18 ENCOUNTER — Ambulatory Visit (HOSPITAL_COMMUNITY): Payer: Commercial Managed Care - PPO

## 2018-05-18 NOTE — Telephone Encounter (Signed)
Called to notify patient that the cardiac and pulmonary rehabilitation department will be closed temporarily due to COVID-19 restrictions. Pt verbalized understanding.  Patient is walking daily with her husband and weight training at home. Pt looking forward to resuming the program when able.  Sol Passer, MS, ACSM CEP 05/18/2018 1046

## 2018-05-20 ENCOUNTER — Encounter (HOSPITAL_COMMUNITY): Payer: Managed Care, Other (non HMO)

## 2018-05-20 ENCOUNTER — Ambulatory Visit (HOSPITAL_COMMUNITY): Payer: Commercial Managed Care - PPO

## 2018-05-23 ENCOUNTER — Encounter (HOSPITAL_COMMUNITY): Payer: Managed Care, Other (non HMO)

## 2018-05-23 ENCOUNTER — Ambulatory Visit (HOSPITAL_COMMUNITY): Payer: Commercial Managed Care - PPO

## 2018-05-25 ENCOUNTER — Ambulatory Visit (HOSPITAL_COMMUNITY): Payer: Commercial Managed Care - PPO

## 2018-05-25 ENCOUNTER — Encounter (HOSPITAL_COMMUNITY): Payer: Managed Care, Other (non HMO)

## 2018-05-27 ENCOUNTER — Ambulatory Visit (HOSPITAL_COMMUNITY): Payer: Commercial Managed Care - PPO

## 2018-05-27 ENCOUNTER — Encounter (HOSPITAL_COMMUNITY): Payer: Managed Care, Other (non HMO)

## 2018-05-30 ENCOUNTER — Encounter (HOSPITAL_COMMUNITY): Payer: Managed Care, Other (non HMO)

## 2018-05-30 ENCOUNTER — Ambulatory Visit (HOSPITAL_COMMUNITY): Payer: Commercial Managed Care - PPO

## 2018-06-01 ENCOUNTER — Ambulatory Visit (HOSPITAL_COMMUNITY): Payer: Commercial Managed Care - PPO

## 2018-06-01 ENCOUNTER — Encounter (HOSPITAL_COMMUNITY): Payer: Managed Care, Other (non HMO)

## 2018-06-03 ENCOUNTER — Encounter (HOSPITAL_COMMUNITY): Payer: Managed Care, Other (non HMO)

## 2018-06-03 ENCOUNTER — Ambulatory Visit (HOSPITAL_COMMUNITY): Payer: Commercial Managed Care - PPO

## 2018-07-06 ENCOUNTER — Telehealth (HOSPITAL_COMMUNITY): Payer: Self-pay | Admitting: *Deleted

## 2018-07-08 ENCOUNTER — Encounter (HOSPITAL_COMMUNITY): Payer: Self-pay

## 2018-07-08 NOTE — Progress Notes (Signed)
Transitioning to virtual cardiac rehab  Dr.  Haroldine Laws   As you are aware our department remains closed to patients due to Covid-19.  We are excited to be able to offer an alternative to traditional onsite Cardiac Rehab while your patient continues to follow Re-Open guidelines.  This is a notification that your patient has been contacted and is very interested in participating in Virtual Cardiac Rehab.  Thank you for your continued support in helping [place] meet the health care needs of our patients.  Tedra Sen[place]. Support Rep II  Cardiac Rehab staff

## 2018-07-11 ENCOUNTER — Encounter (HOSPITAL_COMMUNITY): Payer: Self-pay | Admitting: *Deleted

## 2018-07-11 ENCOUNTER — Telehealth (HOSPITAL_COMMUNITY): Payer: Self-pay

## 2018-07-11 ENCOUNTER — Encounter (HOSPITAL_COMMUNITY)
Admission: RE | Admit: 2018-07-11 | Discharge: 2018-07-11 | Disposition: A | Discharge status: Home / Self Care | Payer: Managed Care, Other (non HMO) | Source: Ambulatory Visit | Attending: Internal Medicine | Admitting: Internal Medicine

## 2018-07-11 DIAGNOSIS — Z951 Presence of aortocoronary bypass graft: Secondary | ICD-10-CM | POA: Insufficient documentation

## 2018-07-11 NOTE — Progress Notes (Signed)
Called and spoke to pt regarding Virtual Cardiac Rehab.  Pt  was able to download the [employer] app on their smart device with no issues. Pt set up their account and received the following welcome message -"Welcome to the [facility] program. We hope that you will find the exercise program beneficial in your recovery process. Our staff is available to assist with in questions/concerns about your exercise routine. Best wishes". Brief orientation provided to with the advisement to watch the "Intro to Rehab" series located under the Resource tab. Pt verbalized understanding. Will continue to follow and monitor pt progress with feedback as needed.Barnet Pall, RN,BSN 07/11/2018 1:07 PM

## 2018-07-11 NOTE — Telephone Encounter (Signed)
° °        Confirm Consent - In the setting of the current Covid19 crisis, you are scheduled for a phone visit with your Cardiac or Pulmonary team member.  Just as we do with many in-gym visits, in order for you to participate in this visit, we must obtain consent.  If you'd like, I can send this to your mychart (if signed up) or email for you to review.  Otherwise, I can obtain your verbal consent now.  By agreeing to a telephone visit, we'd like you to understand that the technology does not allow for your Cardiac or Pulmonary Rehab team member to perform a physical assessment, and thus may limit their ability to fully assess your ability to perform exercise programs. If your provider identifies any concerns that need to be evaluated in person, we will make arrangements to do so.  Finally, though the technology is pretty good, we cannot assure that it will always work on either your or our end and we cannot ensure that we have a secure connection.  Cardiac and Pulmonary Rehab Telehealth visits and At Home cardiac and pulmonary rehab are provided at no cost to you.               Are you willing to proceed?" STAFF: Did the patient verbally acknowledge consent to telehealth visit? Document YES/NO here: Yes      Jessica C.   Cardiac and Pulmonary Rehab Staff   D6/02/2018 T12:03PM

## 2018-08-04 ENCOUNTER — Other Ambulatory Visit: Payer: Self-pay | Admitting: Obstetrics & Gynecology

## 2018-08-04 DIAGNOSIS — Z1231 Encounter for screening mammogram for malignant neoplasm of breast: Secondary | ICD-10-CM

## 2018-08-05 ENCOUNTER — Telehealth (HOSPITAL_COMMUNITY): Payer: Self-pay | Admitting: *Deleted

## 2018-08-10 ENCOUNTER — Encounter (HOSPITAL_COMMUNITY)
Admission: RE | Admit: 2018-08-10 | Discharge: 2018-08-10 | Disposition: A | Discharge status: Home / Self Care | Payer: Managed Care, Other (non HMO) | Source: Ambulatory Visit | Attending: Internal Medicine | Admitting: Internal Medicine

## 2018-08-10 ENCOUNTER — Other Ambulatory Visit: Payer: Self-pay

## 2018-08-10 DIAGNOSIS — Z951 Presence of aortocoronary bypass graft: Secondary | ICD-10-CM | POA: Diagnosis not present

## 2018-08-10 NOTE — Progress Notes (Signed)
Daily Session Note  Patient Details  Name: [name] MRN: [id number] Date of Birth: 1960/03/30 Referring Provider:     CARDIAC REHAB PHASE II ORIENTATION from 02/24/2018 in [place]  Referring Provider  Glori Bickers MD      Encounter Date: 08/10/2018  Check In: Session Check In - 08/10/18 1016      Check-In   Supervising physician immediately available to respond to emergencies  [facility] immediately available    Physician(s)  Dr. Posey Pronto    Location  [facility]    Staff Present  Jiles Garter, RN, Mosie Epstein, MS,ACSM CEP, Exercise Physiologist;Joan Leonia Reeves, RN, BSN;Brittany Durene Fruits, BS, ACSM CEP, Exercise Physiologist;Maria Whitaker, RN, BSN    Virtual Visit  No    Medication changes reported      No    Fall or balance concerns reported     No    Tobacco Cessation  No Change    Warm-up and Cool-down  Performed on first and last piece of equipment    Resistance Training Performed  Yes    VAD Patient?  No    PAD/SET Patient?  No      Pain Assessment   Currently in Pain?  No/denies       Capillary Blood Glucose: No results found for this or any previous visit (from the past 24 hour(s)).    Social History   Tobacco Use  Smoking Status Former Smoker  . Quit date: 09/08/1994  . Years since quitting: 23.9  Smokeless Tobacco Never Used    Goals Met:  Exercise tolerated well  Goals Unmet:  Not Applicable  Comments: Patient returned to cardiac rehab after department closure due to COVID 19. Social distancing and safety measures are in place. Patient tolerated her first day of exercise without difficulty. VSS. Telemetry rhythm Sinus. Fraser Din said that she felt a little depressed initially during the stay at home order but is feeling better now. Barnet Pall, RN,BSN 08/10/2018 10:49 AM   Dr. Fransico Him is Medical Director for Cardiac Rehab at [facility].

## 2018-08-15 ENCOUNTER — Encounter (HOSPITAL_COMMUNITY): Payer: Managed Care, Other (non HMO)

## 2018-08-17 ENCOUNTER — Other Ambulatory Visit: Payer: Self-pay

## 2018-08-17 ENCOUNTER — Encounter (HOSPITAL_COMMUNITY)
Admission: RE | Admit: 2018-08-17 | Discharge: 2018-08-17 | Disposition: A | Discharge status: Home / Self Care | Payer: Managed Care, Other (non HMO) | Source: Ambulatory Visit | Attending: Internal Medicine | Admitting: Internal Medicine

## 2018-08-17 DIAGNOSIS — Z951 Presence of aortocoronary bypass graft: Secondary | ICD-10-CM | POA: Diagnosis not present

## 2018-08-19 ENCOUNTER — Other Ambulatory Visit: Payer: Self-pay

## 2018-08-19 ENCOUNTER — Encounter (HOSPITAL_COMMUNITY): Payer: Managed Care, Other (non HMO)

## 2018-08-19 ENCOUNTER — Encounter (HOSPITAL_COMMUNITY)
Admission: RE | Admit: 2018-08-19 | Discharge: 2018-08-19 | Disposition: A | Discharge status: Home / Self Care | Payer: Managed Care, Other (non HMO) | Source: Ambulatory Visit | Attending: Internal Medicine | Admitting: Internal Medicine

## 2018-08-19 DIAGNOSIS — Z951 Presence of aortocoronary bypass graft: Secondary | ICD-10-CM

## 2018-08-22 ENCOUNTER — Encounter (HOSPITAL_COMMUNITY): Payer: Managed Care, Other (non HMO)

## 2018-08-22 ENCOUNTER — Other Ambulatory Visit: Payer: Self-pay

## 2018-08-22 ENCOUNTER — Encounter (HOSPITAL_COMMUNITY)
Admission: RE | Admit: 2018-08-22 | Discharge: 2018-08-22 | Disposition: A | Discharge status: Home / Self Care | Payer: Managed Care, Other (non HMO) | Source: Ambulatory Visit | Attending: Internal Medicine | Admitting: Internal Medicine

## 2018-08-22 DIAGNOSIS — Z951 Presence of aortocoronary bypass graft: Secondary | ICD-10-CM | POA: Diagnosis not present

## 2018-08-24 ENCOUNTER — Encounter (HOSPITAL_COMMUNITY): Payer: Managed Care, Other (non HMO)

## 2018-08-26 ENCOUNTER — Encounter (HOSPITAL_COMMUNITY): Payer: Managed Care, Other (non HMO)

## 2018-08-29 ENCOUNTER — Encounter (HOSPITAL_COMMUNITY): Payer: Managed Care, Other (non HMO)

## 2018-08-31 ENCOUNTER — Encounter (HOSPITAL_COMMUNITY): Payer: Managed Care, Other (non HMO)

## 2018-09-01 NOTE — Progress Notes (Signed)
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  aspirin 81 MG tablet, Take 1 tablet (81 mg total) by mouth daily., Disp: , Rfl:  .  atorvastatin (LIPITOR) 80 MG tablet, Take 1 tablet (80 mg total) by mouth daily., Disp: 90 tablet, Rfl: 3 .  calcium carbonate (OS-CAL - DOSED IN MG OF ELEMENTAL CALCIUM) 1250 (500 Ca) MG tablet, Take 1 tablet by mouth daily with breakfast., Disp: , Rfl:  .  clopidogrel (PLAVIX) 75 MG tablet, Take 1 tablet (75 mg total) by mouth daily., Disp: 90 tablet, Rfl: 2 .  DULoxetine (CYMBALTA) 60 MG capsule, TAKE ONE CAPSULE BY MOUTH DAILY, Disp: 30 capsule, Rfl: 2 .  famotidine (PEPCID) 40 MG tablet, Take 40 mg by mouth at bedtime. , Disp: , Rfl:  .  lisinopril (PRINIVIL,ZESTRIL) 2.5 MG tablet, Take 1 tablet (2.5 mg total) by mouth daily., Disp: 90 tablet, Rfl: 2 .  metoprolol tartrate (LOPRESSOR) 25 MG tablet, Take 1 tablet (25 mg total) by mouth 2 (two) times daily., Disp: 180 tablet, Rfl: 2 .  Multiple Vitamin (MULTIVITAMIN) tablet, Take 1 tablet by mouth daily., Disp: , Rfl:  .  nitroGLYCERIN (NITROSTAT) 0.4 MG SL tablet, Place 1 tablet (0.4 mg total) under the tongue every 5 (five) minutes as needed., Disp: 25 tablet, Rfl: 3  Past Medical History: Past Medical History:  Diagnosis Date  . Acid reflux   . Coronary artery disease   . Fibroid   . Hyperlipidemia   . Hypertension   . Osteopenia 10/2016   T score -2.1 FRAX's 5.7%/0.3%    Tobacco Use: Social  History   Tobacco Use  Smoking Status Former Smoker  . Quit date: 09/08/1994  . Years since quitting: 23.9  Smokeless Tobacco Never Used    Labs: Recent Review Flowsheet Data    Labs for ITP Cardiac and Pulmonary Rehab Latest Ref Rng & Units 12/08/2017 12/08/2017 12/08/2017 01/25/2018 03/11/2018   Cholestrol 100 - 199 mg/dL - - - 166 145   LDLCALC 0 - 99 mg/dL - - - 102(H) 75   HDL >39 mg/dL - - - 44 48   Trlycerides 0 - 149 mg/dL - - - 100 111   Hemoglobin A1c 4.8 - 5.6 % - - - - -   PHART 7.350 - 7.450 7.287(L) 7.339(L) - - -   PCO2ART 32.0 - 48.0 mmHg 46.8 42.3 - - -   HCO3 20.0 - 28.0 mmol/L 22.5 22.9 - - -   TCO2 22 - 32 mmol/L 24 24 24  - -   ACIDBASEDEF 0.0 - 2.0 mmol/L 4.0(H) 3.0(H) - - -   O2SAT % 95.0 93.0 - - -      Capillary Blood Glucose: Lab Results  Component Value Date   GLUCAP 138 (H) 12/08/2017   GLUCAP 106 (H) 12/08/2017   GLUCAP 98 12/08/2017   GLUCAP 135 (H) 12/08/2017   GLUCAP 149 (H) 12/07/2017     Exercise Target Goals: Exercise Program Goal:  Individual exercise prescription set using results from initial 6 min walk test and THRR while considering  patient's activity barriers and safety.   Exercise Prescription Goal: Initial exercise prescription builds to 30-45 minutes a day of aerobic activity, 2-3 days per week.  Home exercise guidelines will be given to patient during program as part of exercise prescription that the participant will acknowledge.  Activity Barriers & Risk Stratification:   6 Minute Walk:   Oxygen Initial Assessment:   Oxygen Re-Evaluation:   Oxygen Discharge (Final Oxygen Re-Evaluation):   Initial Exercise Prescription:   Perform Capillary Blood Glucose checks as needed.  Exercise Prescription Changes: Exercise Prescription Changes    Row Name 03/07/18 [phone] 03/21/18 0956 04/04/18 0959 04/13/18 1000 08/10/18 1100     Response to Exercise   Blood Pressure (Admit)  108/60  112/68  120/80  124/72  116/60    Blood Pressure (Exercise)  118/80  118/78  164/90  158/80  122/82   Blood Pressure (Exit)  100/60  126/80  108/70  108/80  104/70   Heart Rate (Admit)  79 bpm  85 bpm  63 bpm  84 bpm  64 bpm   Heart Rate (Exercise)  101 bpm  112 bpm  96 bpm  127 bpm  92 bpm   Heart Rate (Exit)  67 bpm  64 bpm  59 bpm  84 bpm  62 bpm   Rating of Perceived Exertion (Exercise)  12  13  13  13  13    Symptoms  none  none  none  none  none   Comments  -  -  -  -  Pt first exercise session since dept. closure due to COVID19   Duration  Progress to 30 minutes of  aerobic without signs/symptoms of physical distress  Progress to 30 minutes of  aerobic without signs/symptoms of physical distress  Progress to 30 minutes of  aerobic without signs/symptoms of physical distress  Progress to 30 minutes of  aerobic without signs/symptoms of physical distress  Progress to 30 minutes of  aerobic without signs/symptoms of physical distress   Intensity  THRR unchanged  THRR unchanged  THRR unchanged  THRR unchanged  THRR unchanged     Progression   Progression  Continue to progress workloads to maintain intensity without signs/symptoms of physical distress.  Continue to progress workloads to maintain intensity without signs/symptoms of physical distress.  Continue to progress workloads to maintain intensity without signs/symptoms of physical distress.  Continue to progress workloads to maintain intensity without signs/symptoms of physical distress.  Continue to progress workloads to maintain intensity without signs/symptoms of physical distress.   Average METs  3.9  4.2  4.4  4.7  3.8     Resistance Training   Training Prescription  Yes  Yes  Yes  No Relaxation day, no weights.  Yes Relaxation day, no weights.   Weight  4lbs  4lbs  4lbs  -  4 lbs.    Reps  10-15  10-15  10-15  -  10-15   Time  10 Minutes  10 Minutes  10 Minutes  -  10 Minutes     Interval Training   Interval Training  No  No  No  No  No     Treadmill   MPH  2.4   2.6  2.7  2.8  2.6   Grade  1  1  1  1  1    Minutes  10  10  10  10  15   METs  3.17  3.26  3.35  3.53  3.35     Recumbant Bike   Level  2.5 Upright Sci[employer] bike  2.5 Upright [employer] bike  2.5 Upright [employer] bike  -  -   Minutes  10  10  10   -  -   METs  5.1  5.3  5.6  -  -     NuStep   Level  4  5  5  5  5    SPM  85  85  85  85  85   Minutes  10  10  10  10  15    METs  3.4  4  4.3  4.4  4.2     Rower   Level  -  -  -  5  -   Watts  -  -  -  50  -   Minutes  -  -  -  10  -   METs  -  -  -  6.1  -     Home Exercise Plan   Plans to continue exercise at  Home (comment) Patient has a treadmill at home.  Home (comment) Patient has a treadmill at home.  Home (comment) Patient has a treadmill at home.  Home (comment) Patient has a treadmill at home.  Home (comment) Patient has a treadmill at home.   Frequency  Add 2 additional days to program exercise sessions.  Add 2 additional days to program exercise sessions.  Add 2 additional days to program exercise sessions.  Add 2 additional days to program exercise sessions.  Add 2 additional days to program exercise sessions.   Initial Home Exercises Provided  03/09/18  03/09/18  03/09/18  03/09/18  03/09/18   Row Name 08/22/18 0929             Response to Exercise   Blood Pressure (Admit)  124/70       Blood Pressure (Exercise)  118/82       Blood Pressure (Exit)  108/68       Heart Rate (Admit)  73 bpm       Heart Rate (Exercise)  112 bpm       Heart Rate (Exit)  67 bpm       Rating of Perceived Exertion (Exercise)  12       Symptoms  none       Comments  -       Duration  Progress to 30 minutes of  aerobic without signs/symptoms of physical distress       Intensity  THRR unchanged         Progression   Progression  Continue to progress workloads to maintain intensity without signs/symptoms of physical distress.       Average METs  3.8         Resistance Training   Training Prescription  Yes       Weight  4 lbs.        Reps   10-15       Time  10 Minutes         Interval Training   Interval Training  No         Treadmill   MPH  2.6       Grade  1       Minutes  15       METs  3.35  NuStep   Level  5       SPM  85       Minutes  15       METs  4.3         Home Exercise Plan   Plans to continue exercise at  Home (comment) Patient has a treadmill at home.       Frequency  Add 2 additional days to program exercise sessions.       Initial Home Exercises Provided  03/09/18          Exercise Comments: Exercise Comments    Row Name 03/07/18 1009 03/09/18 1005 03/21/18 1020 04/04/18 1035 08/10/18 1126   Exercise Comments  Reviewed METs with patient.  Reviewed home exericse guidelines, METs, and goals with patient.  Reviewed METs and goals with patient.  METs reviewed with patient.  Pt first exercise session since dept. closure due to COVID 19. Pt tolerated exercise well. No symptoms.   Bonita Name 08/29/18 1126           Exercise Comments  Patient out of town this week.          Exercise Goals and Review:   Exercise Goals Re-Evaluation : Exercise Goals Re-Evaluation    Row Name 03/09/18 1005 03/21/18 1020 04/27/18 1158 08/10/18 1124 08/29/18 1125     Exercise Goal Re-Evaluation   Exercise Goals Review  Able to understand and use rate of perceived exertion (RPE) scale;Increase Physical Activity;Knowledge and understanding of Target Heart Rate Range (THRR);Able to check pulse independently;Understanding of Exercise Prescription  Able to understand and use rate of perceived exertion (RPE) scale;Increase Physical Activity;Knowledge and understanding of Target Heart Rate Range (THRR);Able to check pulse independently;Understanding of Exercise Prescription;Increase Strength and Stamina  -  Increase Physical Activity;Increase Strength and Stamina;Able to understand and use rate of perceived exertion (RPE) scale;Knowledge and understanding of Target Heart Rate Range (THRR);Able to check pulse  independently;Understanding of Exercise Prescription  Increase Physical Activity;Increase Strength and Stamina;Able to understand and use rate of perceived exertion (RPE) scale;Knowledge and understanding of Target Heart Rate Range (THRR);Able to check pulse independently;Understanding of Exercise Prescription   Comments  Reviewed home exercise guidelines with patient including THRR, RPE scale, and endpoints for exercise. Pt has a treadmill at home and is walking 30 minutes, 2 days/week. Pt knows how to count her pulse independently.  Patient is walking at home in addition to exercise at cardiac rehab. Pt's goal is to be able to clean out her house in preparation [place] the [place]. Pt states that she has been able to begin cleaning out her house without difficulty.  Temporary department closure due to COVID-19.  Pt first exercise session since dept. closure due to COVID 19. Pt tolerated exercise well and reported no symptoms.  Pt out of town this week, unable to review goals.   Expected Outcomes  Patient will walk on her treadmill at home, 30 minutes, at least 2 days/week.  Continue to progress workloads to help increase stamina to be able to do ADLs and have energy to clean house.  -  Will continue to monitor and progress Pt as tolerated.  Continue to progress workloads once patient returns to exercise.      Discharge Exercise Prescription (Final Exercise Prescription Changes): Exercise Prescription Changes - 08/22/18 0929      Response to Exercise   Blood Pressure (Admit)  124/70    Blood Pressure (Exercise)  118/82    Blood Pressure (Exit)  108/68    Heart Rate (Admit)  73 bpm    Heart Rate (Exercise)  112 bpm    Heart Rate (Exit)  67 bpm    Rating of Perceived Exertion (Exercise)  12    Symptoms  none    Comments  --    Duration  Progress to 30 minutes of  aerobic without signs/symptoms of physical distress    Intensity  THRR unchanged      Progression   Progression  Continue to  progress workloads to maintain intensity without signs/symptoms of physical distress.    Average METs  3.8      Resistance Training   Training Prescription  Yes    Weight  4 lbs.     Reps  10-15    Time  10 Minutes      Interval Training   Interval Training  No      Treadmill   MPH  2.6    Grade  1    Minutes  15    METs  3.35      NuStep   Level  5    SPM  85    Minutes  15    METs  4.3      Home Exercise Plan   Plans to continue exercise at  Home (comment)   Patient has a treadmill at home.   Frequency  Add 2 additional days to program exercise sessions.    Initial Home Exercises Provided  03/09/18       Nutrition:  Target Goals: Understanding of nutrition guidelines, daily intake of sodium 1500mg , cholesterol 200mg , calories 30% from fat and 7% or less from saturated fats, daily to have 5 or more servings of fruits and vegetables.  Biometrics:    Nutrition Therapy Plan and Nutrition Goals: Nutrition Therapy & Goals - 03/07/18 1031      Nutrition Therapy   Diet  heart healthy      Personal Nutrition Goals   Nutrition Goal  Pt to identify and limit food sources of saturated fat, trans fat, refined carbohydrates and sodium    Personal Goal #2  Pt to choose desserts carefully.    Personal Goal #3  Pt able to name foods that affect blood glucose.      Intervention Plan   Intervention  Prescribe, educate and counsel regarding individualized specific dietary modifications aiming towards targeted core components such as weight, hypertension, lipid management, diabetes, heart failure and other comorbidities.    Expected Outcomes  Short Term Goal: Understand basic principles of dietary content, such as calories, fat, sodium, cholesterol and nutrients.;Long Term Goal: Adherence to prescribed nutrition plan.       Nutrition Assessments:   Nutrition Goals Re-Evaluation:   Nutrition Goals Re-Evaluation:   Nutrition Goals Discharge (Final Nutrition Goals  Re-Evaluation):   Psychosocial: Target Goals: Acknowledge presence or absence of significant depression and/or stress, maximize coping skills, provide positive support system. Participant is able to verbalize types and ability to use techniques and skills needed for reducing stress and depression.  Initial Review & Psychosocial Screening:   Quality of Life Scores:  Scores of 19 and below usually indicate a poorer quality of life in these areas.  A difference of  2-3 points is a clinically meaningful difference.  A difference of 2-3 points in the total score of the Quality of Life Index has been associated with significant improvement in overall quality of life, self-image, physical symptoms, and general health in studies assessing change in  quality of life.  PHQ-9: Recent Review Flowsheet Data    Depression screen [facility] 2/9 08/10/2018 03/02/2018   Decreased Interest 0 0   Down, Depressed, Hopeless 0 0   PHQ - 2 Score 0 0     Interpretation of Total Score  Total Score Depression Severity:  1-4 = Minimal depression, 5-9 = Mild depression, 10-14 = Moderate depression, 15-19 = Moderately severe depression, 20-27 = Severe depression   Psychosocial Evaluation and Intervention:   Psychosocial Re-Evaluation: Psychosocial Re-Evaluation    Ryan Name 03/17/18 1055 04/14/18 1525 08/10/18 1131 09/01/18 1456       Psychosocial Re-Evaluation   Current issues with  Current Depression  Current Depression  History of Depression  History of Depression    Comments  Fraser Din has not voiced any increased stressors or increase in depression  Fraser Din has not voiced any increased stressors or increase in depression  Pat returned to exercise today. Pat voiced that she experienced a little depression during the stay at home order but denies feeling depressed now  Fraser Din continues to deny feelig depressed currently.  No intervention necessary.    Expected Outcomes  Will continue to offer emotional support as needed.  Will  continue to offer emotional support as needed.  Fraser Din will continue to report decreased depression.    Interventions  Encouraged to attend Cardiac Rehabilitation for the exercise;Stress management education  Encouraged to attend Cardiac Rehabilitation for the exercise;Stress management education  Encouraged to attend Cardiac Rehabilitation for the exercise  Encouraged to attend Cardiac Rehabilitation for the exercise    Continue Psychosocial Services   No Follow up required  No Follow up required  No Follow up required  No Follow up required       Psychosocial Discharge (Final Psychosocial Re-Evaluation): Psychosocial Re-Evaluation - 09/01/18 1456      Psychosocial Re-Evaluation   Current issues with  History of Depression    Comments  Fraser Din continues to deny feelig depressed currently.  No intervention necessary.    Expected Outcomes  Fraser Din will continue to report decreased depression.    Interventions  Encouraged to attend Cardiac Rehabilitation for the exercise    Continue Psychosocial Services   No Follow up required       Vocational Rehabilitation: Provide vocational rehab assistance to qualifying candidates.   Vocational Rehab Evaluation & Intervention:   Education: Education Goals: Education classes will be provided on a weekly basis, covering required topics. Participant will state understanding/return demonstration of topics presented.  Learning Barriers/Preferences:   Education Topics: Count Your Pulse:  -Group instruction provided by verbal instruction, demonstration, patient participation and written materials to support subject.  Instructors address importance of being able to find your pulse and how to count your pulse when at home without a heart monitor.  Patients get hands on experience counting their pulse with staff help and individually.   CARDIAC REHAB PHASE II EXERCISE from 03/04/2018 in [facility]  Date  03/04/18  Educator  RN   Instruction Review Code  2- Demonstrated Understanding      Heart Attack, Angina, and Risk Factor Modification:  -Group instruction provided by verbal instruction, video, and written materials to support subject.  Instructors address signs and symptoms of angina and heart attacks.    Also discuss risk factors for heart disease and how to make changes to improve heart health risk factors.   Functional Fitness:  -Group instruction provided by verbal instruction, demonstration, patient participation, and written  materials to support subject.  Instructors address safety measures for doing things around the house.  Discuss how to get up and down off the floor, how to pick things up properly, how to safely get out of a chair without assistance, and balance training.   Meditation and Mindfulness:  -Group instruction provided by verbal instruction, patient participation, and written materials to support subject.  Instructor addresses importance of mindfulness and meditation practice to help reduce stress and improve awareness.  Instructor also leads participants through a meditation exercise.    Stretching for Flexibility and Mobility:  -Group instruction provided by verbal instruction, patient participation, and written materials to support subject.  Instructors lead participants through series of stretches that are designed to increase flexibility thus improving mobility.  These stretches are additional exercise for major muscle groups that are typically performed during regular warm up and cool down.   Hands Only CPR:  -Group verbal, video, and participation provides a basic overview of AHA guidelines for community CPR. Role-play of emergencies allow participants the opportunity to practice calling for help and chest compression technique with discussion of AED use.   Hypertension: -Group verbal and written instruction that provides a basic overview of hypertension including the most recent  diagnostic guidelines, risk factor reduction with self-care instructions and medication management.    Nutrition I class: Heart Healthy Eating:  -Group instruction provided by PowerPoint slides, verbal discussion, and written materials to support subject matter. The instructor gives an explanation and review of the Therapeutic Lifestyle Changes diet recommendations, which includes a discussion on lipid goals, dietary fat, sodium, fiber, plant stanol/sterol esters, sugar, and the components of a well-balanced, healthy diet.   Nutrition II class: Lifestyle Skills:  -Group instruction provided by PowerPoint slides, verbal discussion, and written materials to support subject matter. The instructor gives an explanation and review of label reading, grocery shopping for heart health, heart healthy recipe modifications, and ways to make healthier choices when eating out.   Diabetes Question & Answer:  -Group instruction provided by PowerPoint slides, verbal discussion, and written materials to support subject matter. The instructor gives an explanation and review of diabetes co-morbidities, pre- and post-prandial blood glucose goals, pre-exercise blood glucose goals, signs, symptoms, and treatment of hypoglycemia and hyperglycemia, and foot care basics.   Diabetes Blitz:  -Group instruction provided by PowerPoint slides, verbal discussion, and written materials to support subject matter. The instructor gives an explanation and review of the physiology behind type 1 and type 2 diabetes, diabetes medications and rational behind using different medications, pre- and post-prandial blood glucose recommendations and Hemoglobin A1c goals, diabetes diet, and exercise including blood glucose guidelines for exercising safely.    Portion Distortion:  -Group instruction provided by PowerPoint slides, verbal discussion, written materials, and food models to support subject matter. The instructor gives an explanation  of serving size versus portion size, changes in portions sizes over the last 20 years, and what consists of a serving from each food group.   Stress Management:  -Group instruction provided by verbal instruction, video, and written materials to support subject matter.  Instructors review role of stress in heart disease and how to cope with stress positively.     CARDIAC REHAB PHASE II EXERCISE from 03/23/2018 in [facility]  Date  03/09/18  Educator  RN  Instruction Review Code  2- Demonstrated Understanding      Exercising on Your Own:  -Group instruction provided by verbal instruction, power point, and written  materials to support subject.  Instructors discuss benefits of exercise, components of exercise, frequency and intensity of exercise, and end points for exercise.  Also discuss use of nitroglycerin and activating EMS.  Review options of places to exercise outside of rehab.  Review guidelines for sex with heart disease.   Cardiac Drugs I:  -Group instruction provided by verbal instruction and written materials to support subject.  Instructor reviews cardiac drug classes: antiplatelets, anticoagulants, beta blockers, and statins.  Instructor discusses reasons, side effects, and lifestyle considerations for each drug class.   Cardiac Drugs II:  -Group instruction provided by verbal instruction and written materials to support subject.  Instructor reviews cardiac drug classes: angiotensin converting enzyme inhibitors (ACE-I), angiotensin II receptor blockers (ARBs), nitrates, and calcium channel blockers.  Instructor discusses reasons, side effects, and lifestyle considerations for each drug class.   CARDIAC REHAB PHASE II EXERCISE from 03/23/2018 in [facility]  Date  03/23/18  Instruction Review Code  2- Demonstrated Understanding      Anatomy and Physiology of the Circulatory System:  Group verbal and written instruction  and models provide basic cardiac anatomy and physiology, with the coronary electrical and arterial systems. Review of: AMI, Angina, Valve disease, Heart Failure, Peripheral Artery Disease, Cardiac Arrhythmia, Pacemakers, and the ICD.   CARDIAC REHAB PHASE II EXERCISE from 03/23/2018 in [facility]  Date  03/16/18  Educator  RN  Instruction Review Code  2- Demonstrated Understanding      Other Education:  -Group or individual verbal, written, or video instructions that support the educational goals of the cardiac rehab program.   Holiday Eating Survival Tips:  -Group instruction provided by PowerPoint slides, verbal discussion, and written materials to support subject matter. The instructor gives patients tips, tricks, and techniques to help them not only survive but enjoy the holidays despite the onslaught of food that accompanies the holidays.   Knowledge Questionnaire Score:   Core Components/Risk Factors/Patient Goals at Admission:   Core Components/Risk Factors/Patient Goals Review:  Goals and Risk Factor Review    Row Name 03/17/18 1058 04/14/18 1525 08/19/18 1402 09/01/18 1457       Core Components/Risk Factors/Patient Goals Review   Personal Goals Review  Weight Management/Obesity;Lipids;Hypertension  Weight Management/Obesity;Lipids;Hypertension  Weight Management/Obesity;Lipids;Hypertension  Weight Management/Obesity;Lipids;Hypertension    Review  Pat's vital signs have been stable. Fraser Din has been doing well with exercise.  Pat's vital signs have been stable. Fraser Din has been doing well with exercise.  Pat returned to exercise and has been doing well with exercise since cardiac rehab has resumed. Chip Boer will complete exercise at cardiac rehab at the begining of August.  Fraser Din has returned to exercise folowing departmental closure d/t COVID-19.  Fraser Din is tolerating exercise well.  She is currently absent from CR on vacation.  Fraser Din will graduate 09/14/2018.     Expected Outcomes  Patient will continue to participate in phase 2 cardiac rehab for exercise, nutrtion and lifestyle modifications.  Patient will continue to participate in phase 2 cardiac rehab for exercise, nutrtion and lifestyle modifications.  Patient will continue to participate in phase 2 cardiac rehab for exercise, nutrtion and lifestyle modifications.  Patient will continue to participate in cardiac rehab for exercise, nutrition, and lifestyle modifications.       Core Components/Risk Factors/Patient Goals at Discharge (Final Review):  Goals and Risk Factor Review - 09/01/18 1457      Core Components/Risk Factors/Patient Goals Review   Personal Goals Review  Weight Management/Obesity;Lipids;Hypertension    Review  Fraser Din has returned to exercise folowing departmental closure d/t COVID-19.  Fraser Din is tolerating exercise well.  She is currently absent from CR on vacation.  Fraser Din will graduate 09/14/2018.    Expected Outcomes  Patient will continue to participate in cardiac rehab for exercise, nutrition, and lifestyle modifications.       ITP Comments: ITP Comments    Row Name 03/15/18 1557 04/14/18 1524 09/01/18 1454       ITP Comments  30 Day ITP Review. Fraser Din is off to a good start to exercise  30 Day ITP Review. Patient is with good participation and attendance in phase 2 cardiac rehab  30 Day ITP Review.  Fraser Din has returned to exercise folowing departmental closure d/t COVID-19.  Fraser Din is tolerating exercise well.  She is currently absent from CR on vacation.  Fraser Din will graduate 09/14/2018.        Comments: See ITP Comments.

## 2018-09-02 ENCOUNTER — Encounter (HOSPITAL_COMMUNITY): Payer: Managed Care, Other (non HMO)

## 2018-09-05 ENCOUNTER — Encounter (HOSPITAL_COMMUNITY)
Admission: RE | Admit: 2018-09-05 | Discharge: 2018-09-05 | Disposition: A | Discharge status: Home / Self Care | Payer: Managed Care, Other (non HMO) | Source: Ambulatory Visit | Attending: Internal Medicine | Admitting: Internal Medicine

## 2018-09-05 ENCOUNTER — Encounter (HOSPITAL_COMMUNITY): Payer: Managed Care, Other (non HMO)

## 2018-09-05 ENCOUNTER — Other Ambulatory Visit: Payer: Self-pay

## 2018-09-05 DIAGNOSIS — Z951 Presence of aortocoronary bypass graft: Secondary | ICD-10-CM

## 2018-09-07 ENCOUNTER — Encounter (HOSPITAL_COMMUNITY)
Admission: RE | Admit: 2018-09-07 | Discharge: 2018-09-07 | Disposition: A | Discharge status: Home / Self Care | Payer: Managed Care, Other (non HMO) | Source: Ambulatory Visit | Attending: Internal Medicine | Admitting: Internal Medicine

## 2018-09-07 ENCOUNTER — Encounter (HOSPITAL_COMMUNITY): Payer: Managed Care, Other (non HMO)

## 2018-09-07 ENCOUNTER — Other Ambulatory Visit: Payer: Self-pay

## 2018-09-07 DIAGNOSIS — Z951 Presence of aortocoronary bypass graft: Secondary | ICD-10-CM | POA: Diagnosis not present

## 2018-09-09 ENCOUNTER — Encounter (HOSPITAL_COMMUNITY): Payer: Managed Care, Other (non HMO)

## 2018-09-12 ENCOUNTER — Encounter (HOSPITAL_COMMUNITY): Payer: Managed Care, Other (non HMO)

## 2018-09-12 ENCOUNTER — Ambulatory Visit
Admission: RE | Admit: 2018-09-12 | Discharge: 2018-09-12 | Disposition: A | Discharge status: Home / Self Care | Payer: Managed Care, Other (non HMO) | Source: Ambulatory Visit | Attending: Obstetrics & Gynecology | Admitting: Obstetrics & Gynecology

## 2018-09-12 ENCOUNTER — Encounter (HOSPITAL_COMMUNITY)
Admission: RE | Admit: 2018-09-12 | Discharge: 2018-09-12 | Disposition: A | Discharge status: Home / Self Care | Payer: Managed Care, Other (non HMO) | Source: Ambulatory Visit | Attending: Internal Medicine | Admitting: Internal Medicine

## 2018-09-12 ENCOUNTER — Other Ambulatory Visit: Payer: Self-pay

## 2018-09-12 DIAGNOSIS — Z951 Presence of aortocoronary bypass graft: Secondary | ICD-10-CM

## 2018-09-12 DIAGNOSIS — Z1231 Encounter for screening mammogram for malignant neoplasm of breast: Secondary | ICD-10-CM

## 2018-09-14 ENCOUNTER — Encounter (HOSPITAL_COMMUNITY): Payer: Managed Care, Other (non HMO)

## 2018-09-16 ENCOUNTER — Encounter (HOSPITAL_COMMUNITY)
Admission: RE | Admit: 2018-09-16 | Discharge: 2018-09-16 | Disposition: A | Discharge status: Home / Self Care | Payer: Managed Care, Other (non HMO) | Source: Ambulatory Visit | Attending: Internal Medicine | Admitting: Internal Medicine

## 2018-09-16 ENCOUNTER — Other Ambulatory Visit: Payer: Self-pay

## 2018-09-16 VITALS — BP 142/70 | HR 67 | Temp 97.7°F | Ht 62.25 in | Wt 146.6 lb

## 2018-09-16 DIAGNOSIS — Z951 Presence of aortocoronary bypass graft: Secondary | ICD-10-CM

## 2018-09-19 NOTE — Progress Notes (Signed)
Discharge Progress Report  Patient Details  Name: [name] MRN: [id number] Date of Birth: October 03, 1960 Referring Provider:     CARDIAC REHAB PHASE II ORIENTATION from 02/24/2018 in [facility]  Referring Provider  Glori Bickers MD       Number of Visits: 24  Reason for Discharge:  Patient reached a stable level of exercise. Patient independent in their exercise. Patient has met program and personal goals.  Smoking History:  Social History   Tobacco Use  Smoking Status Former Smoker  . Quit date: 09/08/1994  . Years since quitting: 24.0  Smokeless Tobacco Never Used    Diagnosis:  S/P CABG x 3, 12/07/17  ADL UCSD:   Initial Exercise Prescription:   Discharge Exercise Prescription (Final Exercise Prescription Changes): Exercise Prescription Changes - 09/16/18 1053      Response to Exercise   Blood Pressure (Admit)  142/70    Blood Pressure (Exercise)  160/88    Blood Pressure (Exit)  120/70    Heart Rate (Admit)  67 bpm    Heart Rate (Exercise)  104 bpm    Heart Rate (Exit)  65 bpm    Rating of Perceived Exertion (Exercise)  13    Symptoms  none    Duration  Progress to 30 minutes of  aerobic without signs/symptoms of physical distress    Intensity  THRR unchanged      Progression   Progression  Continue to progress workloads to maintain intensity without signs/symptoms of physical distress.    Average METs  4.3      Resistance Training   Training Prescription  Yes    Weight  4lbs    Reps  10-15    Time  10 Minutes      Interval Training   Interval Training  No      Treadmill   MPH  3    Grade  2    Minutes  15    METs  4.12      NuStep   Level  5    SPM  85    Minutes  15    METs  4.4      Home Exercise Plan   Plans to continue exercise at  Home (comment)   Patient has a treadmill at home.   Frequency  Add 4 additional days to program exercise sessions.   Patient is walking daily outside or  on TM   Initial Home Exercises Provided  03/09/18       Functional Capacity: 6 Minute Walk    Row Name 09/07/18 1045         6 Minute Walk   Phase  Discharge     Distance  1784 feet     Distance % Change  3.36 %     Walk Time  6 minutes     # of Rest Breaks  0     MPH  3.38     METS  4.43     RPE  11     Perceived Dyspnea   0     VO2 Peak  15.52     Symptoms  No     Resting HR  70 bpm     Resting BP  122/84     Max Ex. HR  93 bpm     Max Ex. BP  144/86     2 Minute Post BP  138/80        Psychological,  QOL, Others - Outcomes: PHQ 2/9: Depression screen [facility] 2/9 09/16/2018 08/10/2018 03/02/2018  Decreased Interest 0 0 0  Down, Depressed, Hopeless 0 0 0  PHQ - 2 Score 0 0 0    Quality of Life: Quality of Life - 09/07/18 1455      Quality of Life   Select  Quality of Life      Quality of Life Scores   Health/Function Pre  25.53 %    Health/Function Post  28.23 %    Health/Function % Change  10.58 %    Socioeconomic Pre  20.06 %    Socioeconomic Post  27.71 %    Socioeconomic % Change   38.14 %    Psych/Spiritual Pre  24.92 %    Psych/Spiritual Post  27.71 %    Psych/Spiritual % Change  11.2 %    Family Pre  30 %    Family Post  27.6 %    Family % Change  -8 %    GLOBAL Pre  24.79 %    GLOBAL Post  27.97 %    GLOBAL % Change  12.83 %       Personal Goals: Goals established at orientation with interventions provided to work toward goal.    Personal Goals Discharge: Goals and Risk Factor Review    Row Name 04/14/18 1525 08/19/18 1402 09/01/18 1457 09/16/18 1534       Core Components/Risk Factors/Patient Goals Review   Personal Goals Review  Weight Management/Obesity;Lipids;Hypertension  Weight Management/Obesity;Lipids;Hypertension  Weight Management/Obesity;Lipids;Hypertension  Weight Management/Obesity;Lipids;Hypertension    Review  Pat's vital signs have been stable. Fraser Din has been doing well with exercise.  Pat returned to exercise and has been doing  well with exercise since cardiac rehab has resumed. Chip Boer will complete exercise at cardiac rehab at the begining of August.  Fraser Din has returned to exercise folowing departmental closure d/t COVID-19.  Fraser Din is tolerating exercise well.  She is currently absent from CR on vacation.  Fraser Din will graduate 09/14/2018.  Pt has graduated from CR with 23 complete sessions.  She feels that her energy has increased as has her confidence.  Pat enjoyed participating in [place].    Expected Outcomes  Patient will continue to participate in phase 2 cardiac rehab for exercise, nutrtion and lifestyle modifications.  Patient will continue to participate in phase 2 cardiac rehab for exercise, nutrtion and lifestyle modifications.  Patient will continue to participate in cardiac rehab for exercise, nutrition, and lifestyle modifications.  Patient will continue to participate in exercise, nutrition, and lifestyle modifications.  Pat plans to use her treadmill and walk for exercise at home.       Exercise Goals and Review:   Exercise Goals Re-Evaluation: Exercise Goals Re-Evaluation    Row Name 04/27/18 1158 08/10/18 1124 08/29/18 1125 09/07/18 1053 09/16/18 1057     Exercise Goal Re-Evaluation   Exercise Goals Review  -  Increase Physical Activity;Increase Strength and Stamina;Able to understand and use rate of perceived exertion (RPE) scale;Knowledge and understanding of Target Heart Rate Range (THRR);Able to check pulse independently;Understanding of Exercise Prescription  Increase Physical Activity;Increase Strength and Stamina;Able to understand and use rate of perceived exertion (RPE) scale;Knowledge and understanding of Target Heart Rate Range (THRR);Able to check pulse independently;Understanding of Exercise Prescription  Increase Physical Activity;Increase Strength and Stamina;Able to understand and use rate of perceived exertion (RPE) scale;Knowledge and understanding of Target Heart Rate Range (THRR);Able to check pulse  independently;Understanding of Exercise Prescription  Increase Physical Activity;Increase  Strength and Stamina;Able to understand and use rate of perceived exertion (RPE) scale;Knowledge and understanding of Target Heart Rate Range (THRR);Able to check pulse independently;Understanding of Exercise Prescription   Comments  Temporary department closure due to COVID-19.  Pt first exercise session since dept. closure due to COVID 19. Pt tolerated exercise well and reported no symptoms.  Pt out of town this week, unable to review goals.  Patient will complete cardiac rehab and has progressed well. Pt states that she feels more energetic when she exercises. Pt is walking almost daily outside or on treadmill at home 30 mins/day. Pt is also doing hand weights exercises from cardiac rehab using 5lbs weights, 3 sets of 12. Pt wants to lose weight, encouraged patient to increase exercise duration to help with wt loss, and pt is amenable to this.  Patient has progressed well with exercise at cardiac rehab and will continue her exercise routine at home: walking 30 minutes, 5-7 days/week outside or on treadmill. Pt plans to continue logging exercise in the [employer] app at this time.   Expected Outcomes  -  Will continue to monitor and progress Pt as tolerated.  Continue to progress workloads once patient returns to exercise.  Patient will continue daily walking routine to maintain health and fitness gains.  Patient will exercise 30 minutes, 5-7 days/week increasing duration as tolerated up to 45 minutes to help achieve personal health and fitness goals.      Nutrition & Weight - Outcomes:  Post Biometrics - 09/16/18 1053       Post  Biometrics   Height  5' 2.25" (1.581 m)    Weight  66.5 kg    Waist Circumference  32.5 inches    Hip Circumference  39.5 inches    Waist to Hip Ratio  0.82 %    BMI (Calculated)  26.6    Triceps Skinfold  26 mm    % Body Fat  36.6 %    Grip Strength  27.5 kg    Flexibility   14 in    Single Leg Stand  30 seconds       Nutrition:   Nutrition Discharge:   Education Questionnaire Score: Knowledge Questionnaire Score - 09/07/18 1456      Knowledge Questionnaire Score   Pre Score  21/24    Post Score  23/24       Goals reviewed with patient; copy given to patient.

## 2018-09-20 ENCOUNTER — Encounter (HOSPITAL_COMMUNITY): Payer: Self-pay

## 2018-09-20 NOTE — Progress Notes (Signed)
Letter sent to Pt due to inactivity with virtual CR app. Pt has not been logging exercise. If Pt does not respond by 10/04/18 will be dropped from the virtual CR app.   [name] BS, ACSM CEP 09/20/2018 9:13 AM

## 2018-10-03 ENCOUNTER — Other Ambulatory Visit: Payer: Self-pay | Admitting: Physician Assistant

## 2018-10-10 NOTE — Addendum Note (Signed)
Encounter addended by: Noel Christmas, RN on: 10/10/2018 4:42 PM  Actions taken: Episode resolved

## 2018-11-08 ENCOUNTER — Encounter: Payer: Self-pay | Admitting: Gynecology

## 2018-12-02 ENCOUNTER — Other Ambulatory Visit: Payer: Self-pay | Admitting: Physician Assistant

## 2019-02-05 ENCOUNTER — Other Ambulatory Visit: Payer: Self-pay | Admitting: Physician Assistant

## 2019-03-02 ENCOUNTER — Encounter: Payer: Managed Care, Other (non HMO) | Admitting: Obstetrics & Gynecology

## 2019-03-13 ENCOUNTER — Other Ambulatory Visit: Payer: Self-pay

## 2019-03-13 ENCOUNTER — Ambulatory Visit (HOSPITAL_COMMUNITY)
Admission: RE | Admit: 2019-03-13 | Discharge: 2019-03-13 | Disposition: A | Discharge status: Home / Self Care | Payer: Managed Care, Other (non HMO) | Source: Ambulatory Visit | Attending: Internal Medicine | Admitting: Internal Medicine

## 2019-03-13 ENCOUNTER — Encounter (HOSPITAL_COMMUNITY): Payer: Self-pay | Admitting: Internal Medicine

## 2019-03-13 VITALS — BP 130/92 | HR 57 | Wt 148.0 lb

## 2019-03-13 DIAGNOSIS — Z87891 Personal history of nicotine dependence: Secondary | ICD-10-CM | POA: Diagnosis not present

## 2019-03-13 DIAGNOSIS — I2510 Atherosclerotic heart disease of native coronary artery without angina pectoris: Secondary | ICD-10-CM | POA: Insufficient documentation

## 2019-03-13 DIAGNOSIS — I10 Essential (primary) hypertension: Secondary | ICD-10-CM | POA: Diagnosis not present

## 2019-03-13 DIAGNOSIS — Z7982 Long term (current) use of aspirin: Secondary | ICD-10-CM | POA: Insufficient documentation

## 2019-03-13 DIAGNOSIS — E785 Hyperlipidemia, unspecified: Secondary | ICD-10-CM | POA: Diagnosis not present

## 2019-03-13 DIAGNOSIS — Z7902 Long term (current) use of antithrombotics/antiplatelets: Secondary | ICD-10-CM | POA: Insufficient documentation

## 2019-03-13 DIAGNOSIS — K219 Gastro-esophageal reflux disease without esophagitis: Secondary | ICD-10-CM | POA: Diagnosis not present

## 2019-03-13 DIAGNOSIS — Z8249 Family history of ischemic heart disease and other diseases of the circulatory system: Secondary | ICD-10-CM | POA: Insufficient documentation

## 2019-03-13 DIAGNOSIS — Z951 Presence of aortocoronary bypass graft: Secondary | ICD-10-CM | POA: Insufficient documentation

## 2019-03-13 DIAGNOSIS — Z882 Allergy status to sulfonamides status: Secondary | ICD-10-CM | POA: Diagnosis not present

## 2019-03-13 DIAGNOSIS — Z79899 Other long term (current) drug therapy: Secondary | ICD-10-CM | POA: Diagnosis not present

## 2019-03-13 MED ORDER — LISINOPRIL 10 MG PO TABS: 10.0000 mg | ORAL_TABLET | Freq: Every day | ORAL | 11 refills | Status: DC

## 2019-03-13 NOTE — Patient Instructions (Signed)
Increase Lisinopril to 10 mg daily  Follow up with Dr Shona Simpson Elmer Sow in [place] in 6 months, his office will call you for an appointment.

## 2019-03-13 NOTE — Progress Notes (Signed)
Advanced Heart Failure Clinic Note   Referring Physician: PCP: Vicenta Aly, FNP PCP-Cardiologist: Shirlee More, MD   HPI:  [name] is a 59 y.o. female with CAD s/p CABG x 3, HTN, and HLD.   Admitted 12/03/17 after an episode of CP and presyncope in the setting of severe emotional stress. Initial trop negative. Cardiac CT showed 3v CAD. 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 EF was normal by Echo as below. Seen by TCTS and recommended for CABG.   Pt underwent CABG x 3 on 12/07/17 including LIMA to LAD, SVG to RCA, and SVG to OM. Pt had a relatively uncomplicated post op course.  Epicardial pacing wires removed 12/11/17 and chest tube sutures removed 12/12/17. Started on plavix day of discharge.   Here for f/u. Doing great.  Has moved to [employer]. Active. No angina. Chest wall sore and has small incisional hernia. BP 130/80s at home. No edema, orthopnea or PND.   CT FFR 12/05/17 FFR 0.7 mid LAD -> hemodynamically significant proximal LAD stenosis FFR not significant in the LCx or RCA.  Echo 12/05/17 LVEF 60-65%, Grade 1 DD, Mild AI, Trivial MR, Trivial TR.  LHC 12/06/17  Ost RCA lesion is 60% stenosed.  Mid RCA-1 lesion is 50% stenosed.  Mid RCA-2 lesion is 80% stenosed.  Prox Cx to Mid Cx lesion is 80% stenosed.  Mid Cx to Dist Cx lesion is 50% stenosed.  Prox Cx lesion is 30% stenosed.  Ost LAD lesion is 80% stenosed.  Prox LAD lesion is 70% stenosed.  Mid LAD lesion is 30% stenosed.  The left ventricular systolic function is normal.  LV end diastolic pressure is normal.  The left ventricular ejection fraction is greater than 65% by visual estimate.  There is no mitral valve regurgitation.  PFTs 12/06/17 FVC 2.81 (89%) FEV1 2.09 (85%)  Review of systems  complete and found to be negative unless listed in HPI.    Past Medical History:  Diagnosis Date  . Acid reflux   . Coronary artery disease   . Fibroid   . Hyperlipidemia   . Hypertension   . Osteopenia 10/2016   T score -2.1 FRAX's 5.7%/0.3%    Current Outpatient Medications  Medication Sig Dispense Refill  . aspirin 81 MG tablet Take 1 tablet (81 mg total) by mouth daily. (Patient taking differently: Take 81 mg by mouth daily. m-f)    . atorvastatin (LIPITOR) 80 MG tablet Take 1 tablet (80 mg total) by mouth daily. Please contact the office for an appointment to get further refills. [phone]. 90 tablet 0  . calcium carbonate (OS-CAL - DOSED IN MG OF ELEMENTAL CALCIUM) 1250 (500 Ca) MG tablet Take 1 tablet by mouth daily with breakfast.    . clopidogrel (PLAVIX) 75 MG tablet TAKE ONE TABLET BY MOUTH DAILY 90 tablet 0  . DULoxetine (CYMBALTA) 60 MG capsule TAKE ONE CAPSULE BY MOUTH DAILY 30 capsule 2  . famotidine (PEPCID) 40 MG tablet Take 40 mg by mouth at bedtime.     Marland Kitchen lisinopril (ZESTRIL) 2.5 MG tablet Take 1 tablet (2.5 mg total) by mouth daily. Pt needs to make appt with provider for future refills - 2nd attempt 30 tablet 0  . metoprolol tartrate (LOPRESSOR) 25 MG tablet Take 1 tablet (25 mg total) by mouth 2 (two) times daily.  Pt needs to make appt with provider for future refills - 2nd attempt 60 tablet 0  . Multiple Vitamin (MULTIVITAMIN) tablet Take 1 tablet by mouth daily.    . nitroGLYCERIN (NITROSTAT) 0.4 MG SL tablet Place 1 tablet (0.4 mg total) under the tongue every 5 (five) minutes as needed. 25 tablet 3   No current facility-administered medications for this encounter.    Allergies  Allergen Reactions  . Sulfa Antibiotics Swelling      Social History   Socioeconomic History  . Marital status: Married    Spouse name: Not on file  . Number of children: Not on file  . Years of education: Not on file  . Highest education level: Not on file  Occupational  History  . Occupation: pharmacist  Tobacco Use  . Smoking status: Former Smoker    Quit date: 09/08/1994    Years since quitting: 24.5  . Smokeless tobacco: Never Used  Substance and Sexual Activity  . Alcohol use: Yes    Alcohol/week: 0.0 standard drinks    Comment: occ  . Drug use: No  . Sexual activity: Yes  Other Topics Concern  . Not on file  Social History Narrative  . Not on file   Social Determinants of Health   Financial Resource Strain:   . Difficulty of Paying Living Expenses: Not on file  Food Insecurity:   . Worried About Charity fundraiser in the Last Year: Not on file  . Ran Out of Food in the Last Year: Not on file  Transportation Needs:   . Lack of Transportation (Medical): Not on file  . Lack of Transportation (Non-Medical): Not on file  Physical Activity:   . Days of Exercise per Week: Not on file  . Minutes of Exercise per Session: Not on file  Stress:   . Feeling of Stress : Not on file  Social Connections:   . Frequency of Communication with Friends and Family: Not on file  . Frequency of Social Gatherings with Friends and Family: Not on file  . Attends Religious Services: Not on file  . Active Member of Clubs or Organizations: Not on file  . Attends Archivist Meetings: Not on file  . Marital Status: Not on file  Intimate Partner Violence:   . Fear of Current or Ex-Partner: Not on file  . Emotionally Abused: Not on file  . Physically Abused: Not on file  . Sexually Abused: Not on file      Family History  Problem Relation Age of Onset  . Hypertension Mother   . Heart disease Mother   . Diabetes Father   . Hypertension Father   . Heart disease Father   . Diabetes Paternal Grandfather     Vitals:   03/13/19 1026  BP: (!) 130/92  Pulse: (!) 57  SpO2: 100%  Weight: 67.1 kg (148 lb)   Wt Readings from Last 3 Encounters:  03/13/19 67.1 kg (148 lb)  09/16/18 66.5 kg (146 lb 9.7 oz)  03/28/18 64.3 kg (141 lb 12.8 oz)      PHYSICAL EXAM: General:  Well appearing. No resp difficulty HEENT: normal Neck: supple. no JVD. Carotids 2+ bilat; no bruits. No lymphadenopathy or thryomegaly appreciated. Cor: PMI nondisplaced. Regular rate & rhythm. No rubs, gallops or murmurs. Lungs: clear Abdomen: soft, nontender, nondistended. No hepatosplenomegaly. No bruits or masses. Good bowel sounds. Extremities: no cyanosis, clubbing, rash, edema Neuro: alert & orientedx3, cranial nerves grossly intact. moves all 4 extremities  w/o difficulty. Affect pleasant    ECG: SB 55 RSR" Personally reviewed  ASSESSMENT & PLAN:  1. CAD s/p CABG x 3, LIMA to LAD, SVG to RCA, SVG to OM - Overall doing well.  - No s/s ischemia - has small incisional hernia. Reassured her that this is not a major issue. No evidence of incarceration. No need for repair at this point. Will d/w Dr. PVT - Continue ASA, statin, and plavix.  - Given relocation to [facility] have referred to Dr. Elmer Sow - Labs next week with PCP.  2. HTN - BP mildly elevated. - Continue lopressor 25 mg BID - Increase lisinopril to 10    Glori Bickers, MD  10:27 AM

## 2019-03-18 ENCOUNTER — Other Ambulatory Visit: Payer: Self-pay | Admitting: Physician Assistant

## 2019-04-13 ENCOUNTER — Other Ambulatory Visit: Payer: Self-pay | Admitting: Physician Assistant

## 2019-04-14 ENCOUNTER — Other Ambulatory Visit: Payer: Self-pay | Admitting: Physician Assistant

## 2019-04-27 ENCOUNTER — Other Ambulatory Visit (HOSPITAL_COMMUNITY): Payer: Self-pay | Admitting: Internal Medicine

## 2019-05-30 ENCOUNTER — Other Ambulatory Visit: Payer: Self-pay | Admitting: Physician Assistant

## 2019-06-09 ENCOUNTER — Telehealth (HOSPITAL_COMMUNITY): Payer: Self-pay | Admitting: *Deleted

## 2019-06-09 NOTE — Telephone Encounter (Signed)
Records faxed to Dr.Meine as requested by patient.  Fax # [phone]

## 2019-07-27 ENCOUNTER — Other Ambulatory Visit: Payer: Self-pay | Admitting: Physician Assistant

## 2019-09-07 ENCOUNTER — Other Ambulatory Visit: Payer: Self-pay | Admitting: Physician Assistant

## 2019-10-08 IMAGING — MG DIGITAL SCREENING BILATERAL MAMMOGRAM WITH TOMO AND CAD
8 series · 8 of 24 positions shown · non-contrast
Comparison: Previous exam(s).

CLINICAL DATA: Screening.

EXAM:
DIGITAL SCREENING BILATERAL MAMMOGRAM WITH TOMO AND CAD

[R MLO synth-2D]
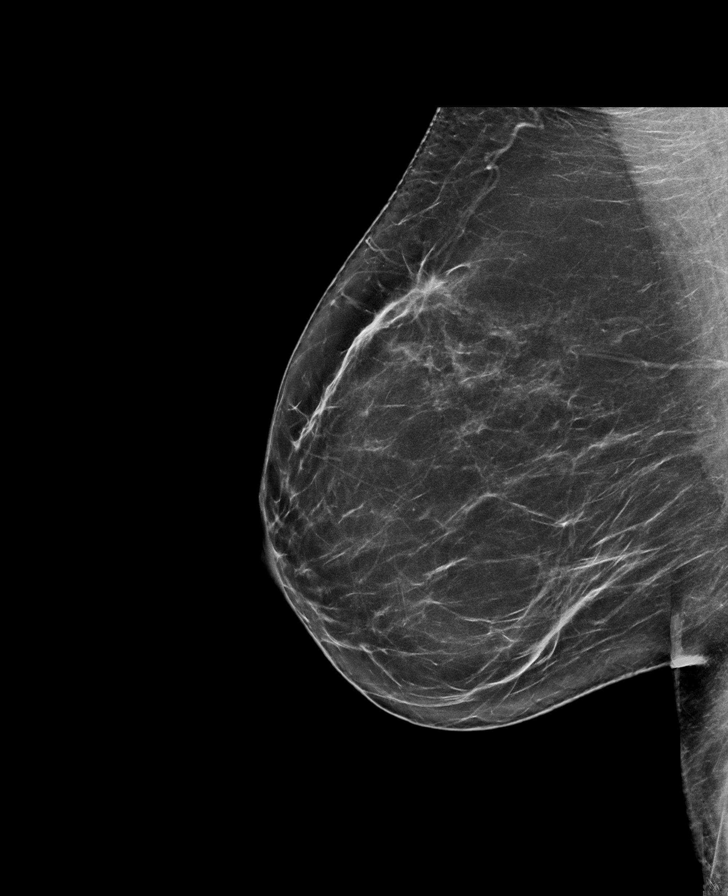

[L CC synth-2D]
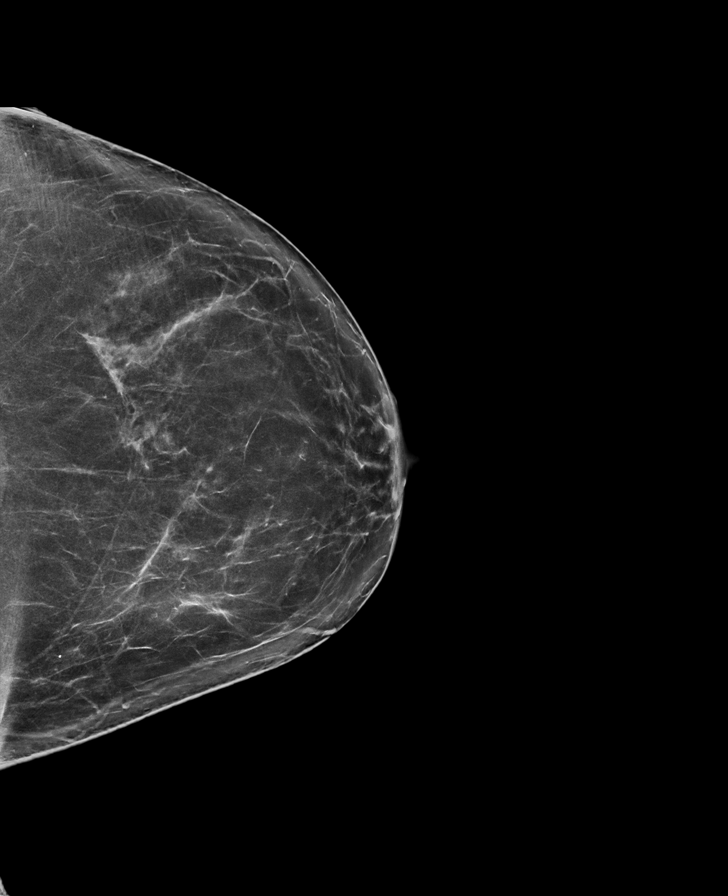

[L MLO synth-2D]
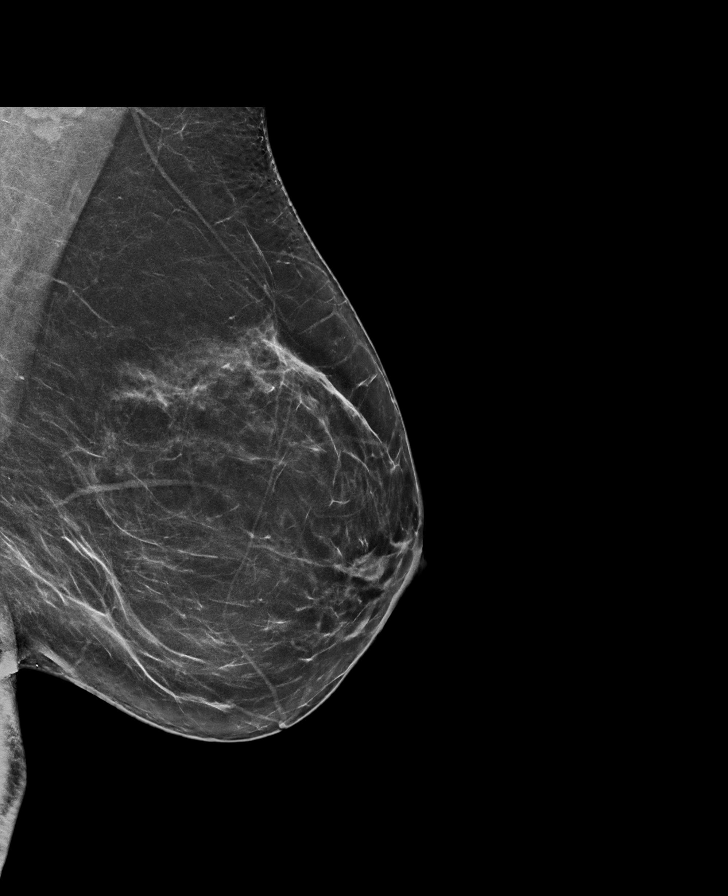

[R CC synth-2D]
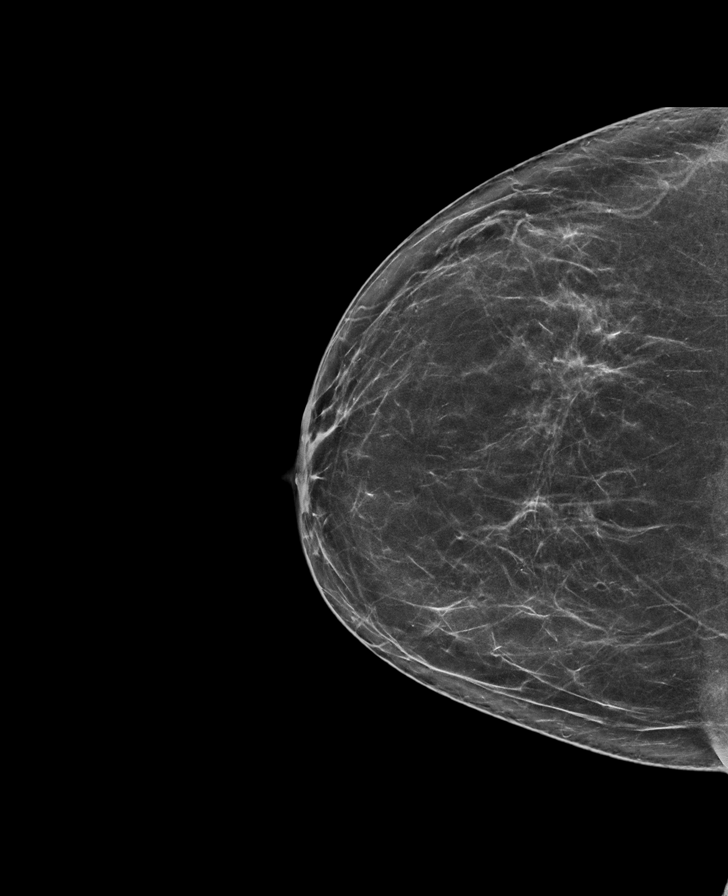

[L CC tomo · tomo slice 41/80.0]
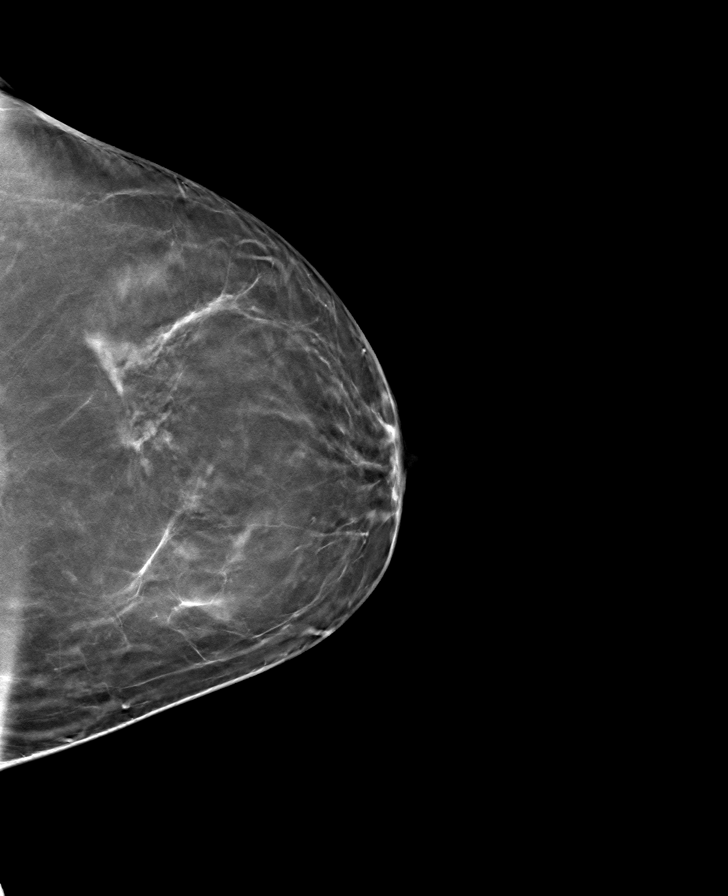

[R MLO tomo · tomo slice 41/80.0]
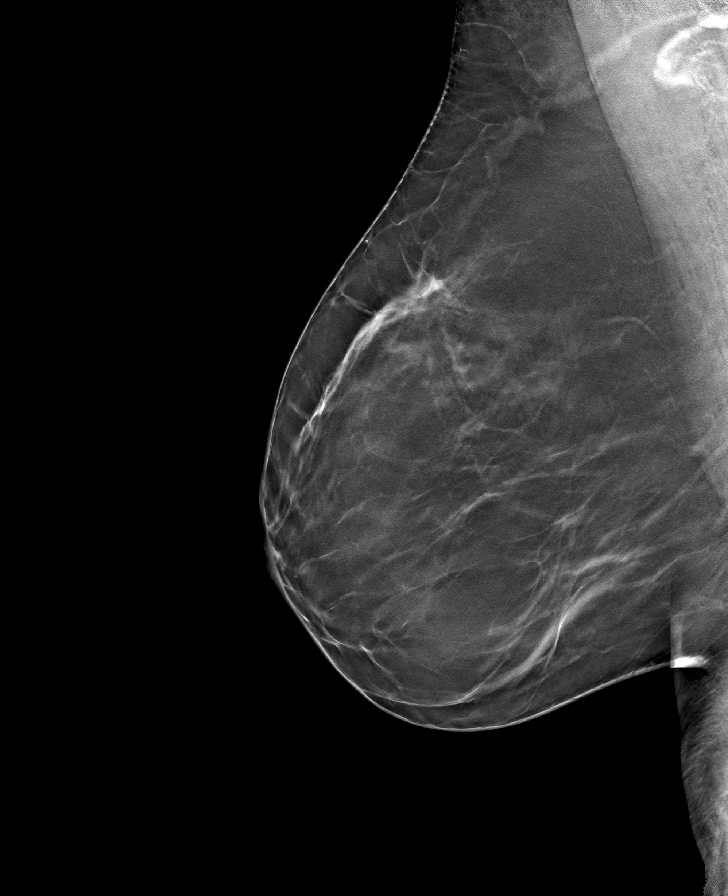

[R CC tomo · tomo slice 37/73.0]
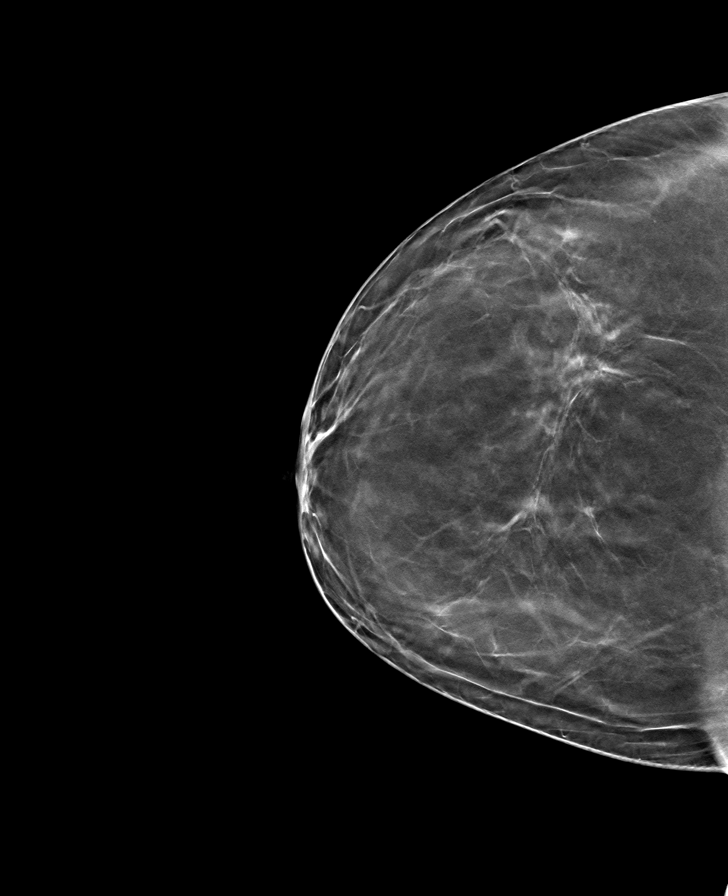

[L MLO tomo · tomo slice 38/75.0]
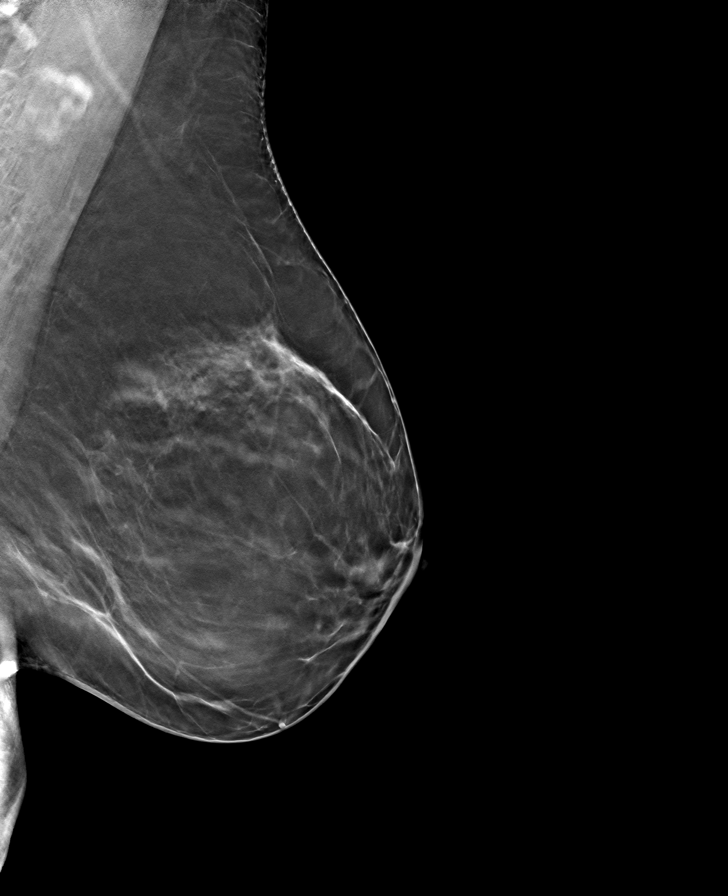

[8 of 24 positions shown; findings below may reference images not displayed]

ACR Breast Density Category b: There are scattered areas of
fibroglandular density.
FINDINGS: There are no findings suspicious for malignancy. Images were
processed with CAD.
IMPRESSION: No mammographic evidence of malignancy. A result letter of this
screening mammogram will be mailed directly to the patient.

RECOMMENDATION:
Screening mammogram in one year. (Code:[id number])

BI-RADS CATEGORY  1: Negative.

## 2020-02-27 ENCOUNTER — Other Ambulatory Visit (HOSPITAL_COMMUNITY): Payer: Self-pay | Admitting: Internal Medicine
